# Patient Record
Sex: Male | Born: 1975 | Race: White | Hispanic: No | Marital: Married
Health system: Southern US, Community
[De-identification: ages and names within clinical notes are randomized; demographics above are authoritative.]

## PROBLEM LIST (undated history)

## (undated) DIAGNOSIS — J9621 Acute and chronic respiratory failure with hypoxia: Secondary | ICD-10-CM

---

## 2019-09-02 ENCOUNTER — Inpatient Hospital Stay
Admission: RE | Admit: 2019-09-02 | Discharge: 2019-10-03 | Disposition: A | Discharge status: Rehabilitation Facility | Payer: BLUE CROSS/BLUE SHIELD | Source: Other Acute Inpatient Hospital | Attending: Internal Medicine | Admitting: Internal Medicine

## 2019-09-02 ENCOUNTER — Other Ambulatory Visit (HOSPITAL_COMMUNITY): Payer: BLUE CROSS/BLUE SHIELD

## 2019-09-02 DIAGNOSIS — I63529 Cerebral infarction due to unspecified occlusion or stenosis of unspecified anterior cerebral artery: Secondary | ICD-10-CM

## 2019-09-02 DIAGNOSIS — I69391 Dysphagia following cerebral infarction: Secondary | ICD-10-CM

## 2019-09-02 DIAGNOSIS — J9621 Acute and chronic respiratory failure with hypoxia: Secondary | ICD-10-CM | POA: Diagnosis present

## 2019-09-02 DIAGNOSIS — G7080 Lambert-Eaton syndrome, unspecified: Secondary | ICD-10-CM

## 2019-09-02 DIAGNOSIS — J9690 Respiratory failure, unspecified, unspecified whether with hypoxia or hypercapnia: Secondary | ICD-10-CM

## 2019-09-02 DIAGNOSIS — Z431 Encounter for attention to gastrostomy: Secondary | ICD-10-CM

## 2019-09-02 HISTORY — DX: Acute and chronic respiratory failure with hypoxia: J96.21

## 2019-09-02 LAB — BLOOD GAS, ARTERIAL
Acid-Base Excess: 2.8 mmol/L — ABNORMAL HIGH (ref 0.0–2.0)
Bicarbonate: 26.1 mmol/L (ref 20.0–28.0)
FIO2: 30
O2 Saturation: 96.8 %
Patient temperature: 36.7
pCO2 arterial: 34.5 mmHg (ref 32.0–48.0)
pH, Arterial: 7.489 — ABNORMAL HIGH (ref 7.350–7.450)
pO2, Arterial: 76.6 mmHg — ABNORMAL LOW (ref 83.0–108.0)

## 2019-09-02 MED ORDER — IOHEXOL 300 MG/ML  SOLN
50.0000 mL | Freq: Once | INTRAMUSCULAR | Status: AC | PRN
  Administered 2019-09-02: 50 mL

## 2019-09-03 ENCOUNTER — Encounter: Payer: Self-pay | Admitting: Internal Medicine

## 2019-09-03 DIAGNOSIS — J9621 Acute and chronic respiratory failure with hypoxia: Secondary | ICD-10-CM | POA: Diagnosis not present

## 2019-09-03 DIAGNOSIS — I63521 Cerebral infarction due to unspecified occlusion or stenosis of right anterior cerebral artery: Secondary | ICD-10-CM | POA: Diagnosis not present

## 2019-09-03 DIAGNOSIS — I69391 Dysphagia following cerebral infarction: Secondary | ICD-10-CM

## 2019-09-03 DIAGNOSIS — G7080 Lambert-Eaton syndrome, unspecified: Secondary | ICD-10-CM

## 2019-09-03 LAB — COMPREHENSIVE METABOLIC PANEL
ALT: 44 U/L (ref 0–44)
AST: 23 U/L (ref 15–41)
Albumin: 3.5 g/dL (ref 3.5–5.0)
Alkaline Phosphatase: 53 U/L (ref 38–126)
Anion gap: 11 (ref 5–15)
BUN: 21 mg/dL — ABNORMAL HIGH (ref 6–20)
CO2: 27 mmol/L (ref 22–32)
Calcium: 9.9 mg/dL (ref 8.9–10.3)
Chloride: 100 mmol/L (ref 98–111)
Creatinine, Ser: 0.45 mg/dL — ABNORMAL LOW (ref 0.61–1.24)
GFR calc Af Amer: >60 mL/min (ref >60)
GFR calc non Af Amer: >60 mL/min (ref >60)
Glucose, Bld: 93 mg/dL (ref 70–99)
Potassium: 3.2 mmol/L — ABNORMAL LOW (ref 3.5–5.1)
Sodium: 138 mmol/L (ref 135–145)
Total Bilirubin: 0.8 mg/dL (ref 0.3–1.2)
Total Protein: 6.6 g/dL (ref 6.5–8.1)

## 2019-09-03 LAB — CBC
HCT: 32.2 % — ABNORMAL LOW (ref 39.0–52.0)
Hemoglobin: 9.6 g/dL — ABNORMAL LOW (ref 13.0–17.0)
MCH: 20.3 pg — ABNORMAL LOW (ref 26.0–34.0)
MCHC: 29.8 g/dL — ABNORMAL LOW (ref 30.0–36.0)
MCV: 67.9 fL — ABNORMAL LOW (ref 80.0–100.0)
Platelets: 317 10*3/uL (ref 150–400)
RBC: 4.74 MIL/uL (ref 4.22–5.81)
RDW: 19.4 % — ABNORMAL HIGH (ref 11.5–15.5)
WBC: 10.7 10*3/uL — ABNORMAL HIGH (ref 4.0–10.5)
nRBC: 0 % (ref 0.0–0.2)

## 2019-09-03 LAB — TSH: TSH: 1.191 u[IU]/mL (ref 0.350–4.500)

## 2019-09-03 NOTE — Consult Note (Signed)
Pulmonary Critical Care Medicine [facility] [place]  PULMONARY SERVICE  Date of Service: 09/03/2019  PULMONARY CRITICAL CARE CONSULT   Bernard Hudson  POE:[id number]  DOB: 1975/10/08   DOA: 09/02/2019  Referring Physician: Carron Curie, MD  HPI: Bernard Hudson is a 44 y.o. male seen for follow up of Acute on Chronic Respiratory Failure.  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  Patient was admitted to the neuro ICU for evaluation was found to be in acute respiratory failure altered mental status dysarthria failure to thrive and about a 40 pound weight loss.  Patient had apparently received the influenza vaccine and developed weakness was diagnosed with Guillain-Barr at that time.  It appears that he had an extensive work-up for his weakness and was eventually diagnosed with Eaton-Lambert syndrome.  Patient underwent plasma exchange x7 and was placed on Mestinon however patient developed diarrhea and did not have much in the way of response.  Patient also was treated with prednisone along with PCP prophylaxis.  For his respiratory failure patient had to have a tracheostomy done and still was reportedly having weakness on pressure support as he was getting fatigued.  He has had numerous attempts at weaning and has failed with retention of CO2 and that is felt that he is going to be vent dependent.  He did have a work-up for possible malignancy however an extensive work-up did not reveal anything significant.  Should be noted that he had a PET scan with hypermetabolic mediastinal lymph nodes but did not undergo biopsies.  Hematology oncology recommended a follow-up CT in 3 months.  Review of Systems:  ROS performed and is unremarkable other than noted above.  Past Medical History Past Medical History:   Diagnosis Date   Guillain Barr syndrome [facility])   Past Surgical History No past surgical history on file. Home Medications Prior to Admission medications  Medication Sig Start Date End Date Taking? Authorizing Provider  atorvastatin (LIPITOR) 20 MG tablet Take 20 mg by mouth daily. Historical Provider, MD  CALCIUM CARBONATE/VITAMIN D3 (VITAMIN D-3 ORAL) Take 1 tablet by mouth daily. Historical Provider, MD  dicyclomine (BENTYL) 20 mg tablet Take 20 mg by mouth. 06/27/19 07/07/19 Historical Provider, MD  pyridostigmine bromide (MESTINON) 60 mg tablet 60 mg per 1 tablet, ORAL, TID (3 times a day), 90 tablet, 3 Refill(s), Pharmacy: [id number], Height, 185.42, cm, 04/29/19 9:28:00 EDT, Weight, 78.926, kg, 04/29/19 9:28:00 EDT 04/29/19 Historical Provider, MD  silver sulfADIAZINE (SILVADENE, SSD) 1 % cream Apply 1 application topically daily. 11/16/14 11/16/15 Historical Provider, MD   Allergies No Known Allergies Family History No family history on file. Social History Social History   Socioeconomic History   Marital status: Married  Spouse name: Not on file   Number of children: Not on file   Years of education: Not on file   Highest education level: Not on file  Occupational History   Not on file  Tobacco Use   Smoking status: Current Every Day Smoker  Packs/day: 0.50  Years: 20.00  Pack years: 10.00  Types: Cigarettes   Smokeless tobacco: Never Used  Substance and Sexual Activity   Alcohol use: Not Currently   Drug use: Never   Sexual activity: Not on file  Other Topics Concern   Not on file  Social History Narrative  Not on file     Medications: Reviewed on Rounds  Physical Exam:  Vitals: Temperature 98.1 pulse 71 respiratory rate 15 blood pressure is 120/79 saturations 100%  Ventilator Settings patient was on ventilator with full support   General: Comfortable at this time  Eyes: Grossly normal lids, irises & conjunctiva  ENT:  grossly tongue is normal  Neck: no obvious mass  Cardiovascular: S1-S2 normal no gallop or rub  Respiratory: No rhonchi no rales are noted  Abdomen: Soft and nontender  Skin: no rash seen on limited exam  Musculoskeletal: not rigid  Psychiatric:unable to assess  Neurologic: no seizure no involuntary movements         Labs on Admission:  Basic Metabolic Panel: Recent Labs  Lab 09/03/19 0700  NA 138  K 3.2*  CL 100  CO2 27  GLUCOSE 93  BUN 21*  CREATININE 0.45*  CALCIUM 9.9    Recent Labs  Lab 09/02/19 1807  PHART 7.489*  PCO2ART 34.5  PO2ART 76.6*  HCO3 26.1  O2SAT 96.8    Liver Function Tests: Recent Labs  Lab 09/03/19 0700  AST 23  ALT 44  ALKPHOS 53  BILITOT 0.8  PROT 6.6  ALBUMIN 3.5   No results for input(s): LIPASE, AMYLASE in the last 168 hours. No results for input(s): AMMONIA in the last 168 hours.  CBC: Recent Labs  Lab 09/03/19 0700  WBC 10.7*  HGB 9.6*  HCT 32.2*  MCV 67.9*  PLT 317    Cardiac Enzymes: No results for input(s): CKTOTAL, CKMB, CKMBINDEX, TROPONINI in the last 168 hours.  BNP (last 3 results) No results for input(s): BNP in the last 8760 hours.  ProBNP (last 3 results) No results for input(s): PROBNP in the last 8760 hours.   Radiological Exams on Admission: DG ABDOMEN PEG TUBE LOCATION  Result Date: 09/02/2019 CLINICAL DATA:  Peg tube placement EXAM: ABDOMEN - 1 VIEW COMPARISON:  None. FINDINGS: 30 cc Omnipaque 300 administered through patient's PEG tube. A single overhead image of the abdomen obtained. Contrast opacifies the stomach and proximal duodenum. There is no gross extravasation of contrast. IMPRESSION: Gastrostomy tube appears to be within the stomach. No gross extravasation. Electronically Signed   By: Jasmine Pang M.D.   On: 09/02/2019 17:36   DG CHEST PORT 1 VIEW  Result Date: 09/02/2019 CLINICAL DATA:  Respiratory failure EXAM: PORTABLE CHEST 1 VIEW COMPARISON:  None. FINDINGS: Tracheostomy  tube in place with tip about 5 cm superior to the carina. No focal opacity or pleural effusion. Normal cardiomediastinal silhouette. No pneumothorax. IMPRESSION: No active disease. Electronically Signed   By: Jasmine Pang M.D.   On: 09/02/2019 17:36    Assessment/Plan Principal Problem:   Acute on chronic respiratory failure with hypoxia (HCC) Active Problems:   Eaton-Lambert myasthenic syndrome (HCC)   Acute arterial ischemic stroke, multifocal, anterior circulation (HCC)   Dysphagia as late effect of cerebrovascular accident (CVA)   1. Acute on chronic respiratory failure with hypoxia underlying patient has L EMS contributing to his overall weakness.  Patient has had plasma exchange done x7 as well as steroid therapy and Mestinon which does not appear to have responded to therapy.  Concern is that he may not be able to wean off the ventilator with persistence of failure on pressure support weaning attempts. 2. Eaton-Lambert syndrome patient had an extensive work-up however has not shown any obvious site of malignancy.  He did have enlarged lymph nodes felt possibly to be reactive by hematology  oncology they will see him after discharge 3. Acute stroke had a right frontal stroke in July 17 imaging studies.  Patient continues on anticoagulation. 4. Dysphagia secondary to the neuromuscular process going on patient is at risk for aspiration needs to be monitored closely.  Last chest x-ray done was actually clear  I have personally seen and evaluated the patient, evaluated laboratory and imaging results, formulated the assessment and plan and placed orders. The Patient requires high complexity decision making with multiple systems involvement.  Case was discussed on Rounds with the Respiratory Therapy Director and the Respiratory staff Time Spent  Yevonne Pax, MD [facility] Pulmonary Critical Care Medicine Sleep Medicine

## 2019-09-04 ENCOUNTER — Encounter: Payer: Self-pay | Admitting: Internal Medicine

## 2019-09-04 DIAGNOSIS — I69391 Dysphagia following cerebral infarction: Secondary | ICD-10-CM

## 2019-09-04 DIAGNOSIS — G7080 Lambert-Eaton syndrome, unspecified: Secondary | ICD-10-CM | POA: Diagnosis not present

## 2019-09-04 DIAGNOSIS — J9621 Acute and chronic respiratory failure with hypoxia: Secondary | ICD-10-CM | POA: Diagnosis not present

## 2019-09-04 DIAGNOSIS — I63521 Cerebral infarction due to unspecified occlusion or stenosis of right anterior cerebral artery: Secondary | ICD-10-CM | POA: Diagnosis not present

## 2019-09-04 DIAGNOSIS — I63529 Cerebral infarction due to unspecified occlusion or stenosis of unspecified anterior cerebral artery: Secondary | ICD-10-CM

## 2019-09-04 HISTORY — DX: Lambert-Eaton syndrome, unspecified: G70.80

## 2019-09-04 HISTORY — DX: Dysphagia following cerebral infarction: I69.391

## 2019-09-04 HISTORY — DX: Cerebral infarction due to unspecified occlusion or stenosis of unspecified anterior cerebral artery: I63.529

## 2019-09-04 LAB — POTASSIUM: Potassium: 4 mmol/L (ref 3.5–5.1)

## 2019-09-04 NOTE — Progress Notes (Addendum)
Pulmonary Critical Care Medicine [facility] [place]   PULMONARY CRITICAL CARE SERVICE  PROGRESS NOTE  Date of Service: 09/04/2019  Bernard Hudson  QBH:[id number]  DOB: 1975-05-02   DOA: 09/02/2019  Referring Physician: Carron Curie, MD  HPI: Bernard Hudson is a 44 y.o. male seen for follow up of Acute on Chronic Respiratory Failure.  Patient completed 2 hours on pressure support today is back on the pressure control on the vent rate of 15 with FiO2 of 30% satting well with no distress.  Medications: Reviewed on Rounds  Physical Exam:  Vitals: Pulse 75 respirations 20 BP 127/76 O2 sat 100% temp 96.9  Ventilator Settings Mild AC PC rate of 15 inspiratory pressure 15 PEEP of 5 and FiO2 30%  . General: Comfortable at this time . Eyes: Grossly normal lids, irises & conjunctiva . ENT: grossly tongue is normal . Neck: no obvious mass . Cardiovascular: S1 S2 normal no gallop . Respiratory: Coarse breath sounds . Abdomen: soft . Skin: no rash seen on limited exam . Musculoskeletal: not rigid . Psychiatric:unable to assess . Neurologic: no seizure no involuntary movements         Lab Data:   Basic Metabolic Panel: Recent Labs  Lab 09/03/19 0700 09/04/19 1446  NA 138  --   K 3.2* 4.0  CL 100  --   CO2 27  --   GLUCOSE 93  --   BUN 21*  --   CREATININE 0.45*  --   CALCIUM 9.9  --     ABG: Recent Labs  Lab 09/02/19 1807  PHART 7.489*  PCO2ART 34.5  PO2ART 76.6*  HCO3 26.1  O2SAT 96.8    Liver Function Tests: Recent Labs  Lab 09/03/19 0700  AST 23  ALT 44  ALKPHOS 53  BILITOT 0.8  PROT 6.6  ALBUMIN 3.5   No results for input(s): LIPASE, AMYLASE in the last 168 hours. No results for input(s): AMMONIA in the last 168 hours.  CBC: Recent Labs  Lab 09/03/19 0700  WBC 10.7*  HGB 9.6*  HCT 32.2*  MCV 67.9*  PLT 317    Cardiac Enzymes: No results for input(s): CKTOTAL, CKMB, CKMBINDEX, TROPONINI in the last 168 hours.  BNP (last 3  results) No results for input(s): BNP in the last 8760 hours.  ProBNP (last 3 results) No results for input(s): PROBNP in the last 8760 hours.  Radiological Exams: No results found.  Assessment/Plan Principal Problem:   Acute on chronic respiratory failure with hypoxia (HCC) Active Problems:   Eaton-Lambert myasthenic syndrome (HCC)   Acute arterial ischemic stroke, multifocal, anterior circulation (HCC)   Dysphagia as late effect of cerebrovascular accident (CVA)   1. Acute on chronic respiratory failure with hypoxia patient will continue to attempt weaning as tolerated completed 2 hours on pressure support today which was goal.  Continue aggressive pulmonary toilet supportive measures we will continue to wean FiO2 as tolerated. 2. Eaton-Lambert syndrome patient had an extensive work-up however has not shown any obvious site of malignancy.  He did have enlarged lymph nodes felt possibly to be reactive by hematology oncology they will see him after discharge 3. Acute stroke had a right frontal stroke in July 17 imaging studies.  Patient continues on anticoagulation. 4. Dysphagia secondary to the neuromuscular process going on patient is at risk for aspiration needs to be monitored closely.  Last chest x-ray done was actually clear   I have personally seen and evaluated the patient, evaluated laboratory and imaging  results, formulated the assessment and plan and placed orders. The Patient requires high complexity decision making with multiple systems involvement.  Rounds were done with the Respiratory Therapy Director and Staff therapists and discussed with nursing staff also.  Allyne Gee, MD [facility] Pulmonary Critical Care Medicine Sleep Medicine

## 2019-09-05 ENCOUNTER — Other Ambulatory Visit (HOSPITAL_COMMUNITY): Payer: BLUE CROSS/BLUE SHIELD

## 2019-09-05 DIAGNOSIS — G7080 Lambert-Eaton syndrome, unspecified: Secondary | ICD-10-CM | POA: Diagnosis not present

## 2019-09-05 DIAGNOSIS — I63521 Cerebral infarction due to unspecified occlusion or stenosis of right anterior cerebral artery: Secondary | ICD-10-CM | POA: Diagnosis not present

## 2019-09-05 DIAGNOSIS — J9621 Acute and chronic respiratory failure with hypoxia: Secondary | ICD-10-CM | POA: Diagnosis not present

## 2019-09-05 DIAGNOSIS — I69391 Dysphagia following cerebral infarction: Secondary | ICD-10-CM | POA: Diagnosis not present

## 2019-09-05 LAB — BASIC METABOLIC PANEL
Anion gap: 11 (ref 5–15)
BUN: 20 mg/dL (ref 6–20)
CO2: 23 mmol/L (ref 22–32)
Calcium: 9.5 mg/dL (ref 8.9–10.3)
Chloride: 101 mmol/L (ref 98–111)
Creatinine, Ser: <0.3 mg/dL — ABNORMAL LOW (ref 0.61–1.24)
Glucose, Bld: 87 mg/dL (ref 70–99)
Potassium: 3.5 mmol/L (ref 3.5–5.1)
Sodium: 135 mmol/L (ref 135–145)

## 2019-09-05 LAB — CBC
HCT: 30.7 % — ABNORMAL LOW (ref 39.0–52.0)
Hemoglobin: 9.4 g/dL — ABNORMAL LOW (ref 13.0–17.0)
MCH: 20.4 pg — ABNORMAL LOW (ref 26.0–34.0)
MCHC: 30.6 g/dL (ref 30.0–36.0)
MCV: 66.7 fL — ABNORMAL LOW (ref 80.0–100.0)
Platelets: 314 10*3/uL (ref 150–400)
RBC: 4.6 MIL/uL (ref 4.22–5.81)
RDW: 18.6 % — ABNORMAL HIGH (ref 11.5–15.5)
WBC: 9 10*3/uL (ref 4.0–10.5)
nRBC: 0 % (ref 0.0–0.2)

## 2019-09-05 NOTE — Progress Notes (Signed)
Pulmonary Critical Care Medicine [facility] [place]   PULMONARY CRITICAL CARE SERVICE  PROGRESS NOTE  Date of Service: 09/05/2019  Bernard Hudson  DXA:[id number]  DOB: 1975/02/09   DOA: 09/02/2019  Referring Physician: Carron Curie, MD  HPI: Bernard Hudson is a 44 y.o. male seen for follow up of Acute on Chronic Respiratory Failure.  Patient currently is on pressure support has been on 30% FiO2 this morning the goal is for 4 hours.  He actually looks good is got excellent volume so hopefully will be able to continue to advance  Medications: Reviewed on Rounds  Physical Exam:  Vitals: Temperature 97.8 pulse 62 respiratory rate 16 blood pressure 108/78 saturations 100%  Ventilator Settings on pressure support FiO2 30% pressure 12/5 tidal volume 705  . General: Comfortable at this time . Eyes: Grossly normal lids, irises & conjunctiva . ENT: grossly tongue is normal . Neck: no obvious mass . Cardiovascular: S1 S2 normal no gallop . Respiratory: No rhonchi coarse breath sounds . Abdomen: soft . Skin: no rash seen on limited exam . Musculoskeletal: not rigid . Psychiatric:unable to assess . Neurologic: no seizure no involuntary movements         Lab Data:   Basic Metabolic Panel: Recent Labs  Lab 09/03/19 0700 09/04/19 1446 09/05/19 0804  NA 138  --  135  K 3.2* 4.0 3.5  CL 100  --  101  CO2 27  --  23  GLUCOSE 93  --  87  BUN 21*  --  20  CREATININE 0.45*  --  <0.30*  CALCIUM 9.9  --  9.5    ABG: Recent Labs  Lab 09/02/19 1807  PHART 7.489*  PCO2ART 34.5  PO2ART 76.6*  HCO3 26.1  O2SAT 96.8    Liver Function Tests: Recent Labs  Lab 09/03/19 0700  AST 23  ALT 44  ALKPHOS 53  BILITOT 0.8  PROT 6.6  ALBUMIN 3.5   No results for input(s): LIPASE, AMYLASE in the last 168 hours. No results for input(s): AMMONIA in the last 168 hours.  CBC: Recent Labs  Lab 09/03/19 0700 09/05/19 0804  WBC 10.7* 9.0  HGB 9.6* 9.4*  HCT 32.2* 30.7*   MCV 67.9* 66.7*  PLT 317 314    Cardiac Enzymes: No results for input(s): CKTOTAL, CKMB, CKMBINDEX, TROPONINI in the last 168 hours.  BNP (last 3 results) No results for input(s): BNP in the last 8760 hours.  ProBNP (last 3 results) No results for input(s): PROBNP in the last 8760 hours.  Radiological Exams: DG CHEST PORT 1 VIEW  Result Date: 09/05/2019 CLINICAL DATA:  44 year old male with history of respiratory failure. EXAM: PORTABLE CHEST 1 VIEW COMPARISON:  Chest x-ray 09/02/2019. FINDINGS: A tracheostomy tube is in place with tip 6.0 cm above the carina. Lung volumes are normal. No consolidative airspace disease. No pleural effusions. No pneumothorax. No pulmonary nodule or mass noted. Pulmonary vasculature and the cardiomediastinal silhouette are within normal limits. IMPRESSION: 1. No radiographic evidence of acute cardiopulmonary disease. Tracheostomy tube appears appropriately positioned Electronically Signed   By: Trudie Reed M.D.   On: 09/05/2019 07:59    Assessment/Plan Principal Problem:   Acute on chronic respiratory failure with hypoxia (HCC) Active Problems:   Eaton-Lambert myasthenic syndrome (HCC)   Acute arterial ischemic stroke, multifocal, anterior circulation (HCC)   Dysphagia as late effect of cerebrovascular accident (CVA)   1. Acute on chronic respiratory failure hypoxia plan is to continue with the weaning on pressure. 2.  Eaton-Lambert myasthenia gravis syndrome plan is to continue with supportive care medical management. 3. Acute stroke no change supportive care 4. Dysphagia we will have speech assess   I have personally seen and evaluated the patient, evaluated laboratory and imaging results, formulated the assessment and plan and placed orders. The Patient requires high complexity decision making with multiple systems involvement.  Rounds were done with the Respiratory Therapy Director and Staff therapists and discussed with nursing staff  also.  Yevonne Pax, MD [facility] Pulmonary Critical Care Medicine Sleep Medicine

## 2019-09-06 DIAGNOSIS — G7080 Lambert-Eaton syndrome, unspecified: Secondary | ICD-10-CM | POA: Diagnosis not present

## 2019-09-06 DIAGNOSIS — I63521 Cerebral infarction due to unspecified occlusion or stenosis of right anterior cerebral artery: Secondary | ICD-10-CM | POA: Diagnosis not present

## 2019-09-06 DIAGNOSIS — J9621 Acute and chronic respiratory failure with hypoxia: Secondary | ICD-10-CM | POA: Diagnosis not present

## 2019-09-06 DIAGNOSIS — I69391 Dysphagia following cerebral infarction: Secondary | ICD-10-CM | POA: Diagnosis not present

## 2019-09-06 NOTE — Progress Notes (Addendum)
Pulmonary Critical Care Medicine [facility] [place]   PULMONARY CRITICAL CARE SERVICE  PROGRESS NOTE  Date of Service: 09/06/2019  Bernard Hudson  NLG:[id number]  DOB: [birth year]   DOA: 09/02/2019  Referring Physician: Carron Curie, MD  HPI: Bernard Hudson is a 44 y.o. male seen for follow up of Acute on Chronic Respiratory Failure.  Patient remains on full support pressure control mode rate 15 with FiO2 of 30% has an 8-hour on pressure support today however is not started weaning yet no distress or fever noted.  Medications: Reviewed on Rounds  Physical Exam:  Vitals: Pulse 70 respirations 15 BP 107/77 O2 sat 100% temp 96.9  Ventilator Settings ventilator mode AC PC rate of 15 expiratory pressure 15 PEEP of 5 and FiO2 of 30%  . General: Comfortable at this time . Eyes: Grossly normal lids, irises & conjunctiva . ENT: grossly tongue is normal . Neck: no obvious mass . Cardiovascular: S1 S2 normal no gallop . Respiratory: Coarse breath sounds . Abdomen: soft . Skin: no rash seen on limited exam . Musculoskeletal: not rigid . Psychiatric:unable to assess . Neurologic: no seizure no involuntary movements         Lab Data:   Basic Metabolic Panel: Recent Labs  Lab 09/03/19 0700 09/04/19 1446 09/05/19 0804  NA 138  --  135  K 3.2* 4.0 3.5  CL 100  --  101  CO2 27  --  23  GLUCOSE 93  --  87  BUN 21*  --  20  CREATININE 0.45*  --  <0.30*  CALCIUM 9.9  --  9.5    ABG: Recent Labs  Lab 09/02/19 1807  PHART 7.489*  PCO2ART 34.5  PO2ART 76.6*  HCO3 26.1  O2SAT 96.8    Liver Function Tests: Recent Labs  Lab 09/03/19 0700  AST 23  ALT 44  ALKPHOS 53  BILITOT 0.8  PROT 6.6  ALBUMIN 3.5   No results for input(s): LIPASE, AMYLASE in the last 168 hours. No results for input(s): AMMONIA in the last 168 hours.  CBC: Recent Labs  Lab 09/03/19 0700 09/05/19 0804  WBC 10.7* 9.0  HGB 9.6* 9.4*  HCT 32.2* 30.7*  MCV 67.9* 66.7*  PLT 317 314     Cardiac Enzymes: No results for input(s): CKTOTAL, CKMB, CKMBINDEX, TROPONINI in the last 168 hours.  BNP (last 3 results) No results for input(s): BNP in the last 8760 hours.  ProBNP (last 3 results) No results for input(s): PROBNP in the last 8760 hours.  Radiological Exams: DG CHEST PORT 1 VIEW  Result Date: 09/05/2019 CLINICAL DATA:  44 year old male with history of respiratory failure. EXAM: PORTABLE CHEST 1 VIEW COMPARISON:  Chest x-ray 09/02/2019. FINDINGS: A tracheostomy tube is in place with tip 6.0 cm above the carina. Lung volumes are normal. No consolidative airspace disease. No pleural effusions. No pneumothorax. No pulmonary nodule or mass noted. Pulmonary vasculature and the cardiomediastinal silhouette are within normal limits. IMPRESSION: 1. No radiographic evidence of acute cardiopulmonary disease. Tracheostomy tube appears appropriately positioned Electronically Signed   By: Trudie Reed M.D.   On: 09/05/2019 07:59    Assessment/Plan Principal Problem:   Acute on chronic respiratory failure with hypoxia (HCC) Active Problems:   Eaton-Lambert myasthenic syndrome (HCC)   Acute arterial ischemic stroke, multifocal, anterior circulation (HCC)   Dysphagia as late effect of cerebrovascular accident (CVA)   1. Acute on chronic respiratory failure hypoxia plan is to continue with the weaning on pressure support per  protocol.  Goals 8 hours today.  Continue supportive measures and pulmonary toilet 2. Eaton-Lambert myasthenia gravis syndrome plan is to continue with supportive care medical management. 3. Acute stroke no change supportive care 4. Dysphagia we will have speech assess   I have personally seen and evaluated the patient, evaluated laboratory and imaging results, formulated the assessment and plan and placed orders. The Patient requires high complexity decision making with multiple systems involvement.  Rounds were done with the Respiratory Therapy  Director and Staff therapists and discussed with nursing staff also.  Yevonne Pax, MD [facility] Pulmonary Critical Care Medicine Sleep Medicine

## 2019-09-07 DIAGNOSIS — I69391 Dysphagia following cerebral infarction: Secondary | ICD-10-CM | POA: Diagnosis not present

## 2019-09-07 DIAGNOSIS — G7080 Lambert-Eaton syndrome, unspecified: Secondary | ICD-10-CM | POA: Diagnosis not present

## 2019-09-07 DIAGNOSIS — J9621 Acute and chronic respiratory failure with hypoxia: Secondary | ICD-10-CM | POA: Diagnosis not present

## 2019-09-07 DIAGNOSIS — I63521 Cerebral infarction due to unspecified occlusion or stenosis of right anterior cerebral artery: Secondary | ICD-10-CM | POA: Diagnosis not present

## 2019-09-07 NOTE — Progress Notes (Signed)
Pulmonary Critical Care Medicine [facility] [place]   PULMONARY CRITICAL CARE SERVICE  PROGRESS NOTE  Date of Service: 09/07/2019  Glenroy Crossen  PPI:[id number]  DOB: 03-11-1975   DOA: 09/02/2019  Referring Physician: Carron Curie, MD  HPI: Bernard Hudson is a 44 y.o. male seen for follow up of Acute on Chronic Respiratory Failure.  Patient currently is on pressure assist control mode has been on 28% FiO2 good saturations are noted  Medications: Reviewed on Rounds  Physical Exam:  Vitals: Temperature is 96.6 pulse 67 respiratory 22 blood pressure is 175/68 saturations 100%  Ventilator Settings on pressure assist control FiO2 28% tidal volume 756 IP 15  . General: Comfortable at this time . Eyes: Grossly normal lids, irises & conjunctiva . ENT: grossly tongue is normal . Neck: no obvious mass . Cardiovascular: S1 S2 normal no gallop . Respiratory: Coarse breath sounds with few scattered rhonchi . Abdomen: soft . Skin: no rash seen on limited exam . Musculoskeletal: not rigid . Psychiatric:unable to assess . Neurologic: no seizure no involuntary movements         Lab Data:   Basic Metabolic Panel: Recent Labs  Lab 09/03/19 0700 09/04/19 1446 09/05/19 0804  NA 138  --  135  K 3.2* 4.0 3.5  CL 100  --  101  CO2 27  --  23  GLUCOSE 93  --  87  BUN 21*  --  20  CREATININE 0.45*  --  <0.30*  CALCIUM 9.9  --  9.5    ABG: Recent Labs  Lab 09/02/19 1807  PHART 7.489*  PCO2ART 34.5  PO2ART 76.6*  HCO3 26.1  O2SAT 96.8    Liver Function Tests: Recent Labs  Lab 09/03/19 0700  AST 23  ALT 44  ALKPHOS 53  BILITOT 0.8  PROT 6.6  ALBUMIN 3.5   No results for input(s): LIPASE, AMYLASE in the last 168 hours. No results for input(s): AMMONIA in the last 168 hours.  CBC: Recent Labs  Lab 09/03/19 0700 09/05/19 0804  WBC 10.7* 9.0  HGB 9.6* 9.4*  HCT 32.2* 30.7*  MCV 67.9* 66.7*  PLT 317 314    Cardiac Enzymes: No results for input(s):  CKTOTAL, CKMB, CKMBINDEX, TROPONINI in the last 168 hours.  BNP (last 3 results) No results for input(s): BNP in the last 8760 hours.  ProBNP (last 3 results) No results for input(s): PROBNP in the last 8760 hours.  Radiological Exams: No results found.  Assessment/Plan Principal Problem:   Acute on chronic respiratory failure with hypoxia (HCC) Active Problems:   Eaton-Lambert myasthenic syndrome (HCC)   Acute arterial ischemic stroke, multifocal, anterior circulation (HCC)   Dysphagia as late effect of cerebrovascular accident (CVA)   1. Acute on chronic respiratory failure hypoxia patient is on full support on pressure control mode overnight he states that he was unhappy with the staff.  They did not appear to understand his medical condition and he states that they were not very nice to him.  This morning as a result he is fatigued.  I spoke to asked him to go ahead and rested for the morning and then this afternoon later we can try once again on pressure support 2. Eaton-Lambert myasthenia patient will be continued on supportive care.  He needs to be weaned as much as he is able to tolerate 3. Acute stroke no change we will continue to follow 4. Dysphagia as a result of above we will continue to monitor   I  have personally seen and evaluated the patient, evaluated laboratory and imaging results, formulated the assessment and plan and placed orders. The Patient requires high complexity decision making with multiple systems involvement.  Rounds were done with the Respiratory Therapy Director and Staff therapists and discussed with nursing staff also.  Allyne Gee, MD [facility] Pulmonary Critical Care Medicine Sleep Medicine

## 2019-09-08 DIAGNOSIS — G7080 Lambert-Eaton syndrome, unspecified: Secondary | ICD-10-CM | POA: Diagnosis not present

## 2019-09-08 DIAGNOSIS — I69391 Dysphagia following cerebral infarction: Secondary | ICD-10-CM | POA: Diagnosis not present

## 2019-09-08 DIAGNOSIS — I63521 Cerebral infarction due to unspecified occlusion or stenosis of right anterior cerebral artery: Secondary | ICD-10-CM | POA: Diagnosis not present

## 2019-09-08 DIAGNOSIS — J9621 Acute and chronic respiratory failure with hypoxia: Secondary | ICD-10-CM | POA: Diagnosis not present

## 2019-09-08 NOTE — Progress Notes (Signed)
Pulmonary Critical Care Medicine [facility] [place]   PULMONARY CRITICAL CARE SERVICE  PROGRESS NOTE  Date of Service: 09/08/2019  [name]  NWG:[id number]  DOB: 06-11-1975   DOA: 09/02/2019  Referring Physician: Carron Curie, MD  HPI: Bernard Hudson is a 44 y.o. male seen for follow up of Acute on Chronic Respiratory Failure.  Patient at this time is on pressure support appears to be comfortable right now without distress has been on 28% FiO2 right now is on 12/5  Medications: Reviewed on Rounds  Physical Exam:  Vitals: Temperature is 96.8 pulse 61 respiratory 15 blood pressure is 125/70 saturations 98%  Ventilator Settings on pressure support FiO2 is 28% tidal volume 655 pressure support 12/5 goal of 16 hours  . General: Comfortable at this time . Eyes: Grossly normal lids, irises & conjunctiva . ENT: grossly tongue is normal . Neck: no obvious mass . Cardiovascular: S1 S2 normal no gallop . Respiratory: Scattered rhonchi noted . Abdomen: soft . Skin: no rash seen on limited exam . Musculoskeletal: not rigid . Psychiatric:unable to assess . Neurologic: no seizure no involuntary movements         Lab Data:   Basic Metabolic Panel: Recent Labs  Lab 09/03/19 0700 09/04/19 1446 09/05/19 0804  NA 138  --  135  K 3.2* 4.0 3.5  CL 100  --  101  CO2 27  --  23  GLUCOSE 93  --  87  BUN 21*  --  20  CREATININE 0.45*  --  <0.30*  CALCIUM 9.9  --  9.5    ABG: Recent Labs  Lab 09/02/19 1807  PHART 7.489*  PCO2ART 34.5  PO2ART 76.6*  HCO3 26.1  O2SAT 96.8    Liver Function Tests: Recent Labs  Lab 09/03/19 0700  AST 23  ALT 44  ALKPHOS 53  BILITOT 0.8  PROT 6.6  ALBUMIN 3.5   No results for input(s): LIPASE, AMYLASE in the last 168 hours. No results for input(s): AMMONIA in the last 168 hours.  CBC: Recent Labs  Lab 09/03/19 0700 09/05/19 0804  WBC 10.7* 9.0  HGB 9.6* 9.4*  HCT 32.2* 30.7*  MCV 67.9* 66.7*  PLT 317 314     Cardiac Enzymes: No results for input(s): CKTOTAL, CKMB, CKMBINDEX, TROPONINI in the last 168 hours.  BNP (last 3 results) No results for input(s): BNP in the last 8760 hours.  ProBNP (last 3 results) No results for input(s): PROBNP in the last 8760 hours.  Radiological Exams: No results found.  Assessment/Plan Principal Problem:   Acute on chronic respiratory failure with hypoxia (HCC) Active Problems:   Eaton-Lambert myasthenic syndrome (HCC)   Acute arterial ischemic stroke, multifocal, anterior circulation (HCC)   Dysphagia as late effect of cerebrovascular accident (CVA)   1. Acute on chronic respiratory failure hypoxia plan is to continue with pressure support mode titrate oxygen continue pulmonary toilet. 2. Eaton-Lambert syndrome supportive care therapy as tolerated 3. Acute stroke no change 4. Dysphagia at baseline continue with supportive care   I have personally seen and evaluated the patient, evaluated laboratory and imaging results, formulated the assessment and plan and placed orders. The Patient requires high complexity decision making with multiple systems involvement.  Rounds were done with the Respiratory Therapy Director and Staff therapists and discussed with nursing staff also.  Yevonne Pax, MD [facility] Pulmonary Critical Care Medicine Sleep Medicine

## 2019-09-09 DIAGNOSIS — I69391 Dysphagia following cerebral infarction: Secondary | ICD-10-CM | POA: Diagnosis not present

## 2019-09-09 DIAGNOSIS — I63521 Cerebral infarction due to unspecified occlusion or stenosis of right anterior cerebral artery: Secondary | ICD-10-CM | POA: Diagnosis not present

## 2019-09-09 DIAGNOSIS — J9621 Acute and chronic respiratory failure with hypoxia: Secondary | ICD-10-CM | POA: Diagnosis not present

## 2019-09-09 DIAGNOSIS — G7080 Lambert-Eaton syndrome, unspecified: Secondary | ICD-10-CM | POA: Diagnosis not present

## 2019-09-09 NOTE — Progress Notes (Addendum)
Pulmonary Critical Care Medicine [facility] [place]   PULMONARY CRITICAL CARE SERVICE  PROGRESS NOTE  Date of Service: 09/09/2019  Bernard Hudson  [id number]:[id number]  DOB: 08/13/1975   DOA: 09/02/2019  Referring Physician: Carron Curie, MD  HPI: Bernard Hudson is a 44 y.o. male seen for follow up of Acute on Chronic Respiratory Failure.  Patient completed 2 hours today on aerosol trach collar 28% now back resting on full support rate of 15 satting well no distress.  Medications: Reviewed on Rounds  Physical Exam:  Vitals: Pulse 73 respirations 19 BP 117/80 O2 sat 100% temp 97.1  Ventilator Settings ventilator mode AC PC rate of 15 expiratory pressure of 15 PEEP of 5 and FiO2 of 28%  . General: Comfortable at this time . Eyes: Grossly normal lids, irises & conjunctiva . ENT: grossly tongue is normal . Neck: no obvious mass . Cardiovascular: S1 S2 normal no gallop . Respiratory: Coarse breath sounds . Abdomen: soft . Skin: no rash seen on limited exam . Musculoskeletal: not rigid . Psychiatric:unable to assess . Neurologic: no seizure no involuntary movements         Lab Data:   Basic Metabolic Panel: Recent Labs  Lab 09/03/19 0700 09/04/19 1446 09/05/19 0804  NA 138  --  135  K 3.2* 4.0 3.5  CL 100  --  101  CO2 27  --  23  GLUCOSE 93  --  87  BUN 21*  --  20  CREATININE 0.45*  --  <0.30*  CALCIUM 9.9  --  9.5    ABG: No results for input(s): PHART, PCO2ART, PO2ART, HCO3, O2SAT in the last 168 hours.  Liver Function Tests: Recent Labs  Lab 09/03/19 0700  AST 23  ALT 44  ALKPHOS 53  BILITOT 0.8  PROT 6.6  ALBUMIN 3.5   No results for input(s): LIPASE, AMYLASE in the last 168 hours. No results for input(s): AMMONIA in the last 168 hours.  CBC: Recent Labs  Lab 09/03/19 0700 09/05/19 0804  WBC 10.7* 9.0  HGB 9.6* 9.4*  HCT 32.2* 30.7*  MCV 67.9* 66.7*  PLT 317 314    Cardiac Enzymes: No results for input(s): CKTOTAL, CKMB,  CKMBINDEX, TROPONINI in the last 168 hours.  BNP (last 3 results) No results for input(s): BNP in the last 8760 hours.  ProBNP (last 3 results) No results for input(s): PROBNP in the last 8760 hours.  Radiological Exams: No results found.  Assessment/Plan Principal Problem:   Acute on chronic respiratory failure with hypoxia (HCC) Active Problems:   Eaton-Lambert myasthenic syndrome (HCC)   Acute arterial ischemic stroke, multifocal, anterior circulation (HCC)   Dysphagia as late effect of cerebrovascular accident (CVA)   1. Acute on chronic respiratory failure hypoxia patient completed 2 hours on aerosol trach collar for weaning.  Now back on full support continue supportive measures and aggressive pulmonary toilet. 2. Eaton-Lambert syndrome supportive care therapy as tolerated 3. Acute stroke no change 4. Dysphagia at baseline continue with supportive care   I have personally seen and evaluated the patient, evaluated laboratory and imaging results, formulated the assessment and plan and placed orders. The Patient requires high complexity decision making with multiple systems involvement.  Rounds were done with the Respiratory Therapy Director and Staff therapists and discussed with nursing staff also.  Yevonne Pax, MD [facility] Pulmonary Critical Care Medicine Sleep Medicine

## 2019-09-10 ENCOUNTER — Other Ambulatory Visit (HOSPITAL_COMMUNITY): Payer: BLUE CROSS/BLUE SHIELD

## 2019-09-10 LAB — CBC
HCT: 33.6 % — ABNORMAL LOW (ref 39.0–52.0)
Hemoglobin: 10.3 g/dL — ABNORMAL LOW (ref 13.0–17.0)
MCH: 21 pg — ABNORMAL LOW (ref 26.0–34.0)
MCHC: 30.7 g/dL (ref 30.0–36.0)
MCV: 68.4 fL — ABNORMAL LOW (ref 80.0–100.0)
Platelets: 287 10*3/uL (ref 150–400)
RBC: 4.91 MIL/uL (ref 4.22–5.81)
RDW: 19.2 % — ABNORMAL HIGH (ref 11.5–15.5)
WBC: 9.6 10*3/uL (ref 4.0–10.5)
nRBC: 0 % (ref 0.0–0.2)

## 2019-09-10 LAB — BASIC METABOLIC PANEL
Anion gap: 13 (ref 5–15)
BUN: 19 mg/dL (ref 6–20)
CO2: 24 mmol/L (ref 22–32)
Calcium: 9.6 mg/dL (ref 8.9–10.3)
Chloride: 99 mmol/L (ref 98–111)
Creatinine, Ser: 0.34 mg/dL — ABNORMAL LOW (ref 0.61–1.24)
GFR calc Af Amer: >60 mL/min (ref >60)
GFR calc non Af Amer: >60 mL/min (ref >60)
Glucose, Bld: 98 mg/dL (ref 70–99)
Potassium: 4.1 mmol/L (ref 3.5–5.1)
Sodium: 136 mmol/L (ref 135–145)

## 2019-09-11 DIAGNOSIS — J9621 Acute and chronic respiratory failure with hypoxia: Secondary | ICD-10-CM | POA: Diagnosis not present

## 2019-09-11 DIAGNOSIS — I63521 Cerebral infarction due to unspecified occlusion or stenosis of right anterior cerebral artery: Secondary | ICD-10-CM | POA: Diagnosis not present

## 2019-09-11 DIAGNOSIS — I69391 Dysphagia following cerebral infarction: Secondary | ICD-10-CM | POA: Diagnosis not present

## 2019-09-11 DIAGNOSIS — G7080 Lambert-Eaton syndrome, unspecified: Secondary | ICD-10-CM | POA: Diagnosis not present

## 2019-09-11 NOTE — Progress Notes (Signed)
Pulmonary Critical Care Medicine [facility] [place]   PULMONARY CRITICAL CARE SERVICE  PROGRESS NOTE  Date of Service: 09/11/2019  Bernard Hudson  ELF:[id number]  DOB: [birth year]   DOA: 09/02/2019  Referring Physician: Carron Curie, MD  HPI: Bernard Hudson is a 44 y.o. male seen for follow up of Acute on Chronic Respiratory Failure.  Patient currently is on pressure support mode has been on 28% FiO2 on 12/5 yesterday patient was able to do about 4 hours on the T collar  Medications: Reviewed on Rounds  Physical Exam:  Vitals: Temperature is 96.5 pulse 78 respiratory 15 blood pressure is 100/59 saturations 99%  Ventilator Settings on pressure support FiO2 28% 12/5  . General: Comfortable at this time . Eyes: Grossly normal lids, irises & conjunctiva . ENT: grossly tongue is normal . Neck: no obvious mass . Cardiovascular: S1 S2 normal no gallop . Respiratory: No rhonchi no rales are noted at this time . Abdomen: soft . Skin: no rash seen on limited exam . Musculoskeletal: not rigid . Psychiatric:unable to assess . Neurologic: no seizure no involuntary movements         Lab Data:   Basic Metabolic Panel: Recent Labs  Lab 09/04/19 1446 09/05/19 0804 09/10/19 0659  NA  --  135 136  K 4.0 3.5 4.1  CL  --  101 99  CO2  --  23 24  GLUCOSE  --  87 98  BUN  --  20 19  CREATININE  --  <0.30* 0.34*  CALCIUM  --  9.5 9.6    ABG: No results for input(s): PHART, PCO2ART, PO2ART, HCO3, O2SAT in the last 168 hours.  Liver Function Tests: No results for input(s): AST, ALT, ALKPHOS, BILITOT, PROT, ALBUMIN in the last 168 hours. No results for input(s): LIPASE, AMYLASE in the last 168 hours. No results for input(s): AMMONIA in the last 168 hours.  CBC: Recent Labs  Lab 09/05/19 0804 09/10/19 0659  WBC 9.0 9.6  HGB 9.4* 10.3*  HCT 30.7* 33.6*  MCV 66.7* 68.4*  PLT 314 287    Cardiac Enzymes: No results for input(s): CKTOTAL, CKMB, CKMBINDEX, TROPONINI  in the last 168 hours.  BNP (last 3 results) No results for input(s): BNP in the last 8760 hours.  ProBNP (last 3 results) No results for input(s): PROBNP in the last 8760 hours.  Radiological Exams: DG CHEST PORT 1 VIEW  Result Date: 09/10/2019 CLINICAL DATA:  Respiratory failure. EXAM: PORTABLE CHEST 1 VIEW COMPARISON:  09/05/2019 FINDINGS: Tracheostomy tube tip is above the carina. Stable cardiomediastinal contours. No pleural effusion or edema. No airspace densities. IMPRESSION: No active cardiopulmonary abnormalities. Electronically Signed   By: Signa Kell M.D.   On: 09/10/2019 08:39    Assessment/Plan Principal Problem:   Acute on chronic respiratory failure with hypoxia (HCC) Active Problems:   Eaton-Lambert myasthenic syndrome (HCC)   Acute arterial ischemic stroke, multifocal, anterior circulation (HCC)   Dysphagia as late effect of cerebrovascular accident (CVA)   1. Acute on chronic respiratory failure hypoxia we will continue with pressure support titrate oxygen continue pulmonary toilet today the plan is to do T collar up to 8 hours.  Encouraged compliance with recommended therapy 2. Eaton-Lambert syndrome no change we will continue with supportive care need to review the chart consider rituximab therapy patient is on a taper of prednisone would consider maybe holding off on aggressive weaning of the prednisone. 3. Acute stroke at baseline we will follow physical therapy as tolerated 4.  Dysphagia no change supportive care   I have personally seen and evaluated the patient, evaluated laboratory and imaging results, formulated the assessment and plan and placed orders. The Patient requires high complexity decision making with multiple systems involvement.  Rounds were done with the Respiratory Therapy Director and Staff therapists and discussed with nursing staff also.  Time 35 minutes  Yevonne Pax, MD [facility] Pulmonary Critical Care Medicine Sleep Medicine

## 2019-09-12 DIAGNOSIS — I69391 Dysphagia following cerebral infarction: Secondary | ICD-10-CM | POA: Diagnosis not present

## 2019-09-12 DIAGNOSIS — I63521 Cerebral infarction due to unspecified occlusion or stenosis of right anterior cerebral artery: Secondary | ICD-10-CM | POA: Diagnosis not present

## 2019-09-12 DIAGNOSIS — J9621 Acute and chronic respiratory failure with hypoxia: Secondary | ICD-10-CM | POA: Diagnosis not present

## 2019-09-12 DIAGNOSIS — G7080 Lambert-Eaton syndrome, unspecified: Secondary | ICD-10-CM | POA: Diagnosis not present

## 2019-09-12 NOTE — Progress Notes (Signed)
Pulmonary Critical Care Medicine [facility] [place]   PULMONARY CRITICAL CARE SERVICE  PROGRESS NOTE  Date of Service: 09/12/2019  Bernard Hudson  FVC:[id number]  DOB: Apr 18, 1975   DOA: 09/02/2019  Referring Physician: Carron Curie, MD  HPI: Bernard Hudson is a 44 y.o. male seen for follow up of Acute on Chronic Respiratory Failure.  Patient was able to do 12 hours yesterday on T collar looks good this morning  Medications: Reviewed on Rounds  Physical Exam:  Vitals: Temperature is 97.4 pulse 84 respiratory 15 blood pressure is 100/71 saturations 100%  Ventilator Settings on T collar  . General: Comfortable at this time . Eyes: Grossly normal lids, irises & conjunctiva . ENT: grossly tongue is normal . Neck: no obvious mass . Cardiovascular: S1 S2 normal no gallop . Respiratory: No rhonchi no rales are noted at this time . Abdomen: soft . Skin: no rash seen on limited exam . Musculoskeletal: not rigid . Psychiatric:unable to assess . Neurologic: no seizure no involuntary movements         Lab Data:   Basic Metabolic Panel: Recent Labs  Lab 09/10/19 0659  NA 136  K 4.1  CL 99  CO2 24  GLUCOSE 98  BUN 19  CREATININE 0.34*  CALCIUM 9.6    ABG: No results for input(s): PHART, PCO2ART, PO2ART, HCO3, O2SAT in the last 168 hours.  Liver Function Tests: No results for input(s): AST, ALT, ALKPHOS, BILITOT, PROT, ALBUMIN in the last 168 hours. No results for input(s): LIPASE, AMYLASE in the last 168 hours. No results for input(s): AMMONIA in the last 168 hours.  CBC: Recent Labs  Lab 09/10/19 0659  WBC 9.6  HGB 10.3*  HCT 33.6*  MCV 68.4*  PLT 287    Cardiac Enzymes: No results for input(s): CKTOTAL, CKMB, CKMBINDEX, TROPONINI in the last 168 hours.  BNP (last 3 results) No results for input(s): BNP in the last 8760 hours.  ProBNP (last 3 results) No results for input(s): PROBNP in the last 8760 hours.  Radiological Exams: No results  found.  Assessment/Plan Principal Problem:   Acute on chronic respiratory failure with hypoxia (HCC) Active Problems:   Eaton-Lambert myasthenic syndrome (HCC)   Acute arterial ischemic stroke, multifocal, anterior circulation (HCC)   Dysphagia as late effect of cerebrovascular accident (CVA)   1. Acute on chronic respiratory failure hypoxia plan is to continue with T collar beyond 12 hours. 2. Eaton-Lambert syndrome seems to show some increase in strength continue with supportive care. 3. Acute stroke patient is at baseline slow improvement 4. Dysphagia slow improvement we will follow   I have personally seen and evaluated the patient, evaluated laboratory and imaging results, formulated the assessment and plan and placed orders. The Patient requires high complexity decision making with multiple systems involvement.  Rounds were done with the Respiratory Therapy Director and Staff therapists and discussed with nursing staff also.  Yevonne Pax, MD [facility] Pulmonary Critical Care Medicine Sleep Medicine

## 2019-09-13 DIAGNOSIS — I63521 Cerebral infarction due to unspecified occlusion or stenosis of right anterior cerebral artery: Secondary | ICD-10-CM | POA: Diagnosis not present

## 2019-09-13 DIAGNOSIS — I69391 Dysphagia following cerebral infarction: Secondary | ICD-10-CM | POA: Diagnosis not present

## 2019-09-13 DIAGNOSIS — G7080 Lambert-Eaton syndrome, unspecified: Secondary | ICD-10-CM | POA: Diagnosis not present

## 2019-09-13 DIAGNOSIS — J9621 Acute and chronic respiratory failure with hypoxia: Secondary | ICD-10-CM | POA: Diagnosis not present

## 2019-09-13 NOTE — Progress Notes (Addendum)
Pulmonary Critical Care Medicine [facility] [place]   PULMONARY CRITICAL CARE SERVICE  PROGRESS NOTE  Date of Service: 09/13/2019  Bernard Hudson  QPR:[id number]  DOB: 04-21-1975   DOA: 09/02/2019  Referring Physician: Carron Curie, MD  HPI: Bernard Hudson is a 44 y.o. male seen for follow up of Acute on Chronic Respiratory Failure.  Patient remains on aerosol trach collar 28% for 16-hour goal today currently satting well no fever distress.  Medications: Reviewed on Rounds  Physical Exam:  Vitals: Pulse 70 respirations 15 BP 93/73 O2 sat 100% temp 97.0  Ventilator Settings ATC 28%  . General: Comfortable at this time . Eyes: Grossly normal lids, irises & conjunctiva . ENT: grossly tongue is normal . Neck: no obvious mass . Cardiovascular: S1 S2 normal no gallop . Respiratory: Coarse breath sounds . Abdomen: soft . Skin: no rash seen on limited exam . Musculoskeletal: not rigid . Psychiatric:unable to assess . Neurologic: no seizure no involuntary movements         Lab Data:   Basic Metabolic Panel: Recent Labs  Lab 09/10/19 0659  NA 136  K 4.1  CL 99  CO2 24  GLUCOSE 98  BUN 19  CREATININE 0.34*  CALCIUM 9.6    ABG: No results for input(s): PHART, PCO2ART, PO2ART, HCO3, O2SAT in the last 168 hours.  Liver Function Tests: No results for input(s): AST, ALT, ALKPHOS, BILITOT, PROT, ALBUMIN in the last 168 hours. No results for input(s): LIPASE, AMYLASE in the last 168 hours. No results for input(s): AMMONIA in the last 168 hours.  CBC: Recent Labs  Lab 09/10/19 0659  WBC 9.6  HGB 10.3*  HCT 33.6*  MCV 68.4*  PLT 287    Cardiac Enzymes: No results for input(s): CKTOTAL, CKMB, CKMBINDEX, TROPONINI in the last 168 hours.  BNP (last 3 results) No results for input(s): BNP in the last 8760 hours.  ProBNP (last 3 results) No results for input(s): PROBNP in the last 8760 hours.  Radiological Exams: No results  found.  Assessment/Plan Principal Problem:   Acute on chronic respiratory failure with hypoxia (HCC) Active Problems:   Eaton-Lambert myasthenic syndrome (HCC)   Acute arterial ischemic stroke, multifocal, anterior circulation (HCC)   Dysphagia as late effect of cerebrovascular accident (CVA)   1. Acute on chronic respiratory failure hypoxia patient remains on aerosol trach collar 28% has a goal of 16 hours today we will continue aggressive pulmonary toilet supportive measures.. 2. Eaton-Lambert syndrome seems to show some increase in strength continue with supportive care. 3. Acute stroke patient is at baseline slow improvement 4. Dysphagia slow improvement we will follow    I have personally seen and evaluated the patient, evaluated laboratory and imaging results, formulated the assessment and plan and placed orders. The Patient requires high complexity decision making with multiple systems involvement.  Rounds were done with the Respiratory Therapy Director and Staff therapists and discussed with nursing staff also.  Yevonne Pax, MD [facility] Pulmonary Critical Care Medicine Sleep Medicine

## 2019-09-14 DIAGNOSIS — I63521 Cerebral infarction due to unspecified occlusion or stenosis of right anterior cerebral artery: Secondary | ICD-10-CM | POA: Diagnosis not present

## 2019-09-14 DIAGNOSIS — G7080 Lambert-Eaton syndrome, unspecified: Secondary | ICD-10-CM | POA: Diagnosis not present

## 2019-09-14 DIAGNOSIS — J9621 Acute and chronic respiratory failure with hypoxia: Secondary | ICD-10-CM | POA: Diagnosis not present

## 2019-09-14 DIAGNOSIS — I69391 Dysphagia following cerebral infarction: Secondary | ICD-10-CM | POA: Diagnosis not present

## 2019-09-14 NOTE — Progress Notes (Addendum)
Pulmonary Critical Care Medicine [facility] [place]   PULMONARY CRITICAL CARE SERVICE  PROGRESS NOTE  Date of Service: 09/14/2019  Bernard Hudson  [id number]  DOB: 1975/04/24   DOA: 09/02/2019  Referring Physician: Carron Curie, MD  HPI: Bernard Hudson is a 44 y.o. male seen for follow up of Acute on Chronic Respiratory Failure. Patient remains weaning on ATC 28%, for a 20 hour goal.    Medications: Reviewed on Rounds  Physical Exam:  Vitals: pulse 75, resp 20, bp 110/70, sat 98%, temp 97.0  Ventilator Settings ATC 28%  . General: Comfortable at this time . Eyes: Grossly normal lids, irises & conjunctiva . ENT: grossly tongue is normal . Neck: no obvious mass . Cardiovascular: S1 S2 normal no gallop . Respiratory: no rales or ronchi noted . Abdomen: soft . Skin: no rash seen on limited exam . Musculoskeletal: not rigid . Psychiatric:unable to assess . Neurologic: no seizure no involuntary movements         Lab Data:   Basic Metabolic Panel: Recent Labs  Lab 09/10/19 0659  NA 136  K 4.1  CL 99  CO2 24  GLUCOSE 98  BUN 19  CREATININE 0.34*  CALCIUM 9.6    ABG: No results for input(s): PHART, PCO2ART, PO2ART, HCO3, O2SAT in the last 168 hours.  Liver Function Tests: No results for input(s): AST, ALT, ALKPHOS, BILITOT, PROT, ALBUMIN in the last 168 hours. No results for input(s): LIPASE, AMYLASE in the last 168 hours. No results for input(s): AMMONIA in the last 168 hours.  CBC: Recent Labs  Lab 09/10/19 0659  WBC 9.6  HGB 10.3*  HCT 33.6*  MCV 68.4*  PLT 287    Cardiac Enzymes: No results for input(s): CKTOTAL, CKMB, CKMBINDEX, TROPONINI in the last 168 hours.  BNP (last 3 results) No results for input(s): BNP in the last 8760 hours.  ProBNP (last 3 results) No results for input(s): PROBNP in the last 8760 hours.  Radiological Exams: No results found.  Assessment/Plan Principal Problem:   Acute on chronic respiratory  failure with hypoxia (HCC) Active Problems:   Eaton-Lambert myasthenic syndrome (HCC)   Acute arterial ischemic stroke, multifocal, anterior circulation (HCC)   Dysphagia as late effect of cerebrovascular accident (CVA)   1. Acute on chronic respiratory failure hypoxia patient remains on aerosol trach collar 28% has a goal of 20 hours today we will continue aggressive pulmonary toilet supportive measures.. 2. Eaton-Lambert syndrome seems to show some increase in strength continue with supportive care. 3. Acute stroke patient is at baseline slow improvement 4. Dysphagia slow improvement we will follow   I have personally seen and evaluated the patient, evaluated laboratory and imaging results, formulated the assessment and plan and placed orders. The Patient requires high complexity decision making with multiple systems involvement.  Rounds were done with the Respiratory Therapy Director and Staff therapists and discussed with nursing staff also.  Yevonne Pax, MD [facility] Pulmonary Critical Care Medicine Sleep Medicine

## 2019-09-15 DIAGNOSIS — I69391 Dysphagia following cerebral infarction: Secondary | ICD-10-CM | POA: Diagnosis not present

## 2019-09-15 DIAGNOSIS — J9621 Acute and chronic respiratory failure with hypoxia: Secondary | ICD-10-CM | POA: Diagnosis not present

## 2019-09-15 DIAGNOSIS — G7080 Lambert-Eaton syndrome, unspecified: Secondary | ICD-10-CM | POA: Diagnosis not present

## 2019-09-15 DIAGNOSIS — I63521 Cerebral infarction due to unspecified occlusion or stenosis of right anterior cerebral artery: Secondary | ICD-10-CM | POA: Diagnosis not present

## 2019-09-15 LAB — BLOOD GAS, ARTERIAL
Acid-Base Excess: 3.8 mmol/L — ABNORMAL HIGH (ref 0.0–2.0)
Bicarbonate: 27.9 mmol/L (ref 20.0–28.0)
FIO2: 28
O2 Saturation: 97.5 %
Patient temperature: 36.5
pCO2 arterial: 42.1 mmHg (ref 32.0–48.0)
pH, Arterial: 7.434 (ref 7.350–7.450)
pO2, Arterial: 88.7 mmHg (ref 83.0–108.0)

## 2019-09-15 NOTE — Progress Notes (Addendum)
Pulmonary Critical Care Medicine [facility] [place]   PULMONARY CRITICAL CARE SERVICE  PROGRESS NOTE  Date of Service: 09/15/2019  Bernard Hudson  FMB:[id number]  DOB: 1975/10/15   DOA: 09/02/2019  Referring Physician: Carron Curie, MD  HPI: Bernard Hudson is a 44 y.o. male seen for follow up of Acute on Chronic Respiratory Failure.  Patient trach was downsized to a #6 cuffless today remains on aerosol trach collar 28% for goal of 24 hours at this time.  Medications: Reviewed on Rounds  Physical Exam:  Vitals: Pulse 81 respirations 23 BP 98/78 O2 sat 99% temp 96.7  Ventilator Settings ATC 28%  . General: Comfortable at this time . Eyes: Grossly normal lids, irises & conjunctiva . ENT: grossly tongue is normal . Neck: no obvious mass . Cardiovascular: S1 S2 normal no gallop . Respiratory: No rales or rhonchi noted . Abdomen: soft . Skin: no rash seen on limited exam . Musculoskeletal: not rigid . Psychiatric:unable to assess . Neurologic: no seizure no involuntary movements         Lab Data:   Basic Metabolic Panel: Recent Labs  Lab 09/10/19 0659  NA 136  K 4.1  CL 99  CO2 24  GLUCOSE 98  BUN 19  CREATININE 0.34*  CALCIUM 9.6    ABG: Recent Labs  Lab 09/15/19 1045  PHART 7.434  PCO2ART 42.1  PO2ART 88.7  HCO3 27.9  O2SAT 97.5    Liver Function Tests: No results for input(s): AST, ALT, ALKPHOS, BILITOT, PROT, ALBUMIN in the last 168 hours. No results for input(s): LIPASE, AMYLASE in the last 168 hours. No results for input(s): AMMONIA in the last 168 hours.  CBC: Recent Labs  Lab 09/10/19 0659  WBC 9.6  HGB 10.3*  HCT 33.6*  MCV 68.4*  PLT 287    Cardiac Enzymes: No results for input(s): CKTOTAL, CKMB, CKMBINDEX, TROPONINI in the last 168 hours.  BNP (last 3 results) No results for input(s): BNP in the last 8760 hours.  ProBNP (last 3 results) No results for input(s): PROBNP in the last 8760 hours.  Radiological Exams: No  results found.  Assessment/Plan Principal Problem:   Acute on chronic respiratory failure with hypoxia (HCC) Active Problems:   Eaton-Lambert myasthenic syndrome (HCC)   Acute arterial ischemic stroke, multifocal, anterior circulation (HCC)   Dysphagia as late effect of cerebrovascular accident (CVA)   1. Acute on chronic respiratory failure hypoxiapatient remains on aerosol trach collar 28% has a goal of 24 hours today we will continue aggressive pulmonary toilet supportive measures.. 2. Eaton-Lambert syndrome seems to show some increase in strength continue with supportive care. 3. Acute stroke patient is at baseline slow improvement 4. Dysphagia slow improvement we will follow   I have personally seen and evaluated the patient, evaluated laboratory and imaging results, formulated the assessment and plan and placed orders. The Patient requires high complexity decision making with multiple systems involvement.  Rounds were done with the Respiratory Therapy Director and Staff therapists and discussed with nursing staff also.  Yevonne Pax, MD [facility] Pulmonary Critical Care Medicine Sleep Medicine

## 2019-09-16 DIAGNOSIS — I63521 Cerebral infarction due to unspecified occlusion or stenosis of right anterior cerebral artery: Secondary | ICD-10-CM | POA: Diagnosis not present

## 2019-09-16 DIAGNOSIS — G7080 Lambert-Eaton syndrome, unspecified: Secondary | ICD-10-CM | POA: Diagnosis not present

## 2019-09-16 DIAGNOSIS — J9621 Acute and chronic respiratory failure with hypoxia: Secondary | ICD-10-CM | POA: Diagnosis not present

## 2019-09-16 DIAGNOSIS — I69391 Dysphagia following cerebral infarction: Secondary | ICD-10-CM | POA: Diagnosis not present

## 2019-09-16 LAB — BASIC METABOLIC PANEL
Anion gap: 12 (ref 5–15)
BUN: 13 mg/dL (ref 6–20)
CO2: 27 mmol/L (ref 22–32)
Calcium: 9.8 mg/dL (ref 8.9–10.3)
Chloride: 98 mmol/L (ref 98–111)
Creatinine, Ser: 0.37 mg/dL — ABNORMAL LOW (ref 0.61–1.24)
GFR calc Af Amer: >60 mL/min (ref >60)
GFR calc non Af Amer: >60 mL/min (ref >60)
Glucose, Bld: 126 mg/dL — ABNORMAL HIGH (ref 70–99)
Potassium: 4.6 mmol/L (ref 3.5–5.1)
Sodium: 137 mmol/L (ref 135–145)

## 2019-09-16 LAB — CBC
HCT: 40 % (ref 39.0–52.0)
Hemoglobin: 12.2 g/dL — ABNORMAL LOW (ref 13.0–17.0)
MCH: 20.9 pg — ABNORMAL LOW (ref 26.0–34.0)
MCHC: 30.5 g/dL (ref 30.0–36.0)
MCV: 68.4 fL — ABNORMAL LOW (ref 80.0–100.0)
Platelets: 351 10*3/uL (ref 150–400)
RBC: 5.85 MIL/uL — ABNORMAL HIGH (ref 4.22–5.81)
RDW: 18.4 % — ABNORMAL HIGH (ref 11.5–15.5)
WBC: 12.6 10*3/uL — ABNORMAL HIGH (ref 4.0–10.5)
nRBC: 0 % (ref 0.0–0.2)

## 2019-09-16 LAB — MAGNESIUM: Magnesium: 2.2 mg/dL (ref 1.7–2.4)

## 2019-09-16 NOTE — Progress Notes (Addendum)
Pulmonary Critical Care Medicine [facility] [place]   PULMONARY CRITICAL CARE SERVICE  PROGRESS NOTE  Date of Service: 09/16/2019  Cornell Bourbon  CVE:[id number]  DOB: 1975/06/08   DOA: 09/02/2019  Referring Physician: Carron Curie, MD  HPI: Bernard Hudson is a 44 y.o. male seen for follow up of Acute on Chronic Respiratory Failure.  Patient remains on aerosol trach collar 28% FiO2 using PMV with no difficulty.  Medications: Reviewed on Rounds  Physical Exam:  Vitals: Pulse 75 respirations 28 BP 130/90 O2 sat 99% temp 96.5  Ventilator Settings ATC 28%  . General: Comfortable at this time . Eyes: Grossly normal lids, irises & conjunctiva . ENT: grossly tongue is normal . Neck: no obvious mass . Cardiovascular: S1 S2 normal no gallop . Respiratory: No rales . Abdomen: soft . Skin: no rash seen on limited exam . Musculoskeletal: not rigid . Psychiatric:unable to assess . Neurologic: no seizure no involuntary movements         Lab Data:   Basic Metabolic Panel: Recent Labs  Lab 09/10/19 0659 09/16/19 1655  NA 136 137  K 4.1 4.6  CL 99 98  CO2 24 27  GLUCOSE 98 126*  BUN 19 13  CREATININE 0.34* 0.37*  CALCIUM 9.6 9.8  MG  --  2.2    ABG: Recent Labs  Lab 09/15/19 1045  PHART 7.434  PCO2ART 42.1  PO2ART 88.7  HCO3 27.9  O2SAT 97.5    Liver Function Tests: No results for input(s): AST, ALT, ALKPHOS, BILITOT, PROT, ALBUMIN in the last 168 hours. No results for input(s): LIPASE, AMYLASE in the last 168 hours. No results for input(s): AMMONIA in the last 168 hours.  CBC: Recent Labs  Lab 09/10/19 0659 09/16/19 1655  WBC 9.6 12.6*  HGB 10.3* 12.2*  HCT 33.6* 40.0  MCV 68.4* 68.4*  PLT 287 351    Cardiac Enzymes: No results for input(s): CKTOTAL, CKMB, CKMBINDEX, TROPONINI in the last 168 hours.  BNP (last 3 results) No results for input(s): BNP in the last 8760 hours.  ProBNP (last 3 results) No results for input(s): PROBNP in the  last 8760 hours.  Radiological Exams: No results found.  Assessment/Plan Principal Problem:   Acute on chronic respiratory failure with hypoxia (HCC) Active Problems:   Eaton-Lambert myasthenic syndrome (HCC)   Acute arterial ischemic stroke, multifocal, anterior circulation (HCC)   Dysphagia as late effect of cerebrovascular accident (CVA)   1. Acute on chronic respiratory failure hypoxiapatient remains on aerosol trach collar 28% has a goal of24 hours today we will continue aggressive pulmonary toilet supportive measures.. 2. Eaton-Lambert syndrome seems to show some increase in strength continue with supportive care. 3. Acute stroke patient is at baseline slow improvement 4. Dysphagia slow improvement we will follow   I have personally seen and evaluated the patient, evaluated laboratory and imaging results, formulated the assessment and plan and placed orders. The Patient requires high complexity decision making with multiple systems involvement.  Rounds were done with the Respiratory Therapy Director and Staff therapists and discussed with nursing staff also.  Yevonne Pax, MD [facility] Pulmonary Critical Care Medicine Sleep Medicine

## 2019-09-17 DIAGNOSIS — J9621 Acute and chronic respiratory failure with hypoxia: Secondary | ICD-10-CM | POA: Diagnosis not present

## 2019-09-17 DIAGNOSIS — G7080 Lambert-Eaton syndrome, unspecified: Secondary | ICD-10-CM | POA: Diagnosis not present

## 2019-09-17 DIAGNOSIS — I69391 Dysphagia following cerebral infarction: Secondary | ICD-10-CM | POA: Diagnosis not present

## 2019-09-17 DIAGNOSIS — I63521 Cerebral infarction due to unspecified occlusion or stenosis of right anterior cerebral artery: Secondary | ICD-10-CM | POA: Diagnosis not present

## 2019-09-17 NOTE — Progress Notes (Signed)
Pulmonary Critical Care Medicine [facility] [place]   PULMONARY CRITICAL CARE SERVICE  PROGRESS NOTE  Date of Service: 09/17/2019  Bernard Hudson  [id number]  DOB: [birth year]   DOA: 09/02/2019  Referring Physician: Carron Curie, MD  HPI: Bernard Hudson is a 44 y.o. male seen for follow up of Acute on Chronic Respiratory Failure.  Patient is on T collar has been on PMV with good saturations he is doing much better Liberated from the ventilator  Medications: Reviewed on Rounds  Physical Exam:  Vitals: Temperature is 96.7 pulse 75 respiratory 31 blood pressure is 129/82 saturations 100%  Ventilator Settings on T collar with PMV   General: Comfortable at this time  Eyes: Grossly normal lids, irises & conjunctiva  ENT: grossly tongue is normal  Neck: no obvious mass  Cardiovascular: S1 S2 normal no gallop  Respiratory: No rhonchi no rales are noted at this time  Abdomen: soft  Skin: no rash seen on limited exam  Musculoskeletal: not rigid  Psychiatric:unable to assess  Neurologic: no seizure no involuntary movements         Lab Data:   Basic Metabolic Panel: Recent Labs  Lab 09/16/19 1655  NA 137  K 4.6  CL 98  CO2 27  GLUCOSE 126*  BUN 13  CREATININE 0.37*  CALCIUM 9.8  MG 2.2    ABG: Recent Labs  Lab 09/15/19 1045  PHART 7.434  PCO2ART 42.1  PO2ART 88.7  HCO3 27.9  O2SAT 97.5    Liver Function Tests: No results for input(s): AST, ALT, ALKPHOS, BILITOT, PROT, ALBUMIN in the last 168 hours. No results for input(s): LIPASE, AMYLASE in the last 168 hours. No results for input(s): AMMONIA in the last 168 hours.  CBC: Recent Labs  Lab 09/16/19 1655  WBC 12.6*  HGB 12.2*  HCT 40.0  MCV 68.4*  PLT 351    Cardiac Enzymes: No results for input(s): CKTOTAL, CKMB, CKMBINDEX, TROPONINI in the last 168 hours.  BNP (last 3 results) No results for input(s): BNP in the last 8760 hours.  ProBNP (last 3 results) No results for  input(s): PROBNP in the last 8760 hours.  Radiological Exams: No results found.  Assessment/Plan Principal Problem:   Acute on chronic respiratory failure with hypoxia (HCC) Active Problems:   Eaton-Lambert myasthenic syndrome (HCC)   Acute arterial ischemic stroke, multifocal, anterior circulation (HCC)   Dysphagia as late effect of cerebrovascular accident (CVA)   1. Acute on chronic respiratory failure hypoxia we will continue with T collar and continue with PMV hopefully be able to switch at respiratory to start capping trials 2. Eaton-Lambert myasthenia supportive care we will continue with supportive care 3. Acute stroke no change 4. Dysphagia slow improvement we will continue to monitor   I have personally seen and evaluated the patient, evaluated laboratory and imaging results, formulated the assessment and plan and placed orders. The Patient requires high complexity decision making with multiple systems involvement.  Rounds were done with the Respiratory Therapy Director and Staff therapists and discussed with nursing staff also.  Yevonne Pax, MD [facility] Pulmonary Critical Care Medicine Sleep Medicine

## 2019-09-18 DIAGNOSIS — I63521 Cerebral infarction due to unspecified occlusion or stenosis of right anterior cerebral artery: Secondary | ICD-10-CM | POA: Diagnosis not present

## 2019-09-18 DIAGNOSIS — G7080 Lambert-Eaton syndrome, unspecified: Secondary | ICD-10-CM | POA: Diagnosis not present

## 2019-09-18 DIAGNOSIS — J9621 Acute and chronic respiratory failure with hypoxia: Secondary | ICD-10-CM | POA: Diagnosis not present

## 2019-09-18 DIAGNOSIS — I69391 Dysphagia following cerebral infarction: Secondary | ICD-10-CM | POA: Diagnosis not present

## 2019-09-18 NOTE — Progress Notes (Signed)
Pulmonary Critical Care Medicine [facility] [place]   PULMONARY CRITICAL CARE SERVICE  PROGRESS NOTE  Date of Service: 09/18/2019  Bernard Hudson  [id number]  DOB: 1975-04-06   DOA: 09/02/2019  Referring Physician: Carron Curie, MD  HPI: Bernard Hudson is a 44 y.o. male seen for follow up of Acute on Chronic Respiratory Failure.  Patient is comfortable right now without distress at this time no fevers  Medications: Reviewed on Rounds  Physical Exam:  Vitals: Temperature is 97.5 pulse 94 respiratory 21 blood pressure is 118/79 saturations 100%  Ventilator Settings on T collar off the ventilator  . General: Comfortable at this time . Eyes: Grossly normal lids, irises & conjunctiva . ENT: grossly tongue is normal . Neck: no obvious mass . Cardiovascular: S1 S2 normal no gallop . Respiratory: No rhonchi no rales are noted at this time . Abdomen: soft . Skin: no rash seen on limited exam . Musculoskeletal: not rigid . Psychiatric:unable to assess . Neurologic: no seizure no involuntary movements         Lab Data:   Basic Metabolic Panel: Recent Labs  Lab 09/16/19 1655  NA 137  K 4.6  CL 98  CO2 27  GLUCOSE 126*  BUN 13  CREATININE 0.37*  CALCIUM 9.8  MG 2.2    ABG: Recent Labs  Lab 09/15/19 1045  PHART 7.434  PCO2ART 42.1  PO2ART 88.7  HCO3 27.9  O2SAT 97.5    Liver Function Tests: No results for input(s): AST, ALT, ALKPHOS, BILITOT, PROT, ALBUMIN in the last 168 hours. No results for input(s): LIPASE, AMYLASE in the last 168 hours. No results for input(s): AMMONIA in the last 168 hours.  CBC: Recent Labs  Lab 09/16/19 1655  WBC 12.6*  HGB 12.2*  HCT 40.0  MCV 68.4*  PLT 351    Cardiac Enzymes: No results for input(s): CKTOTAL, CKMB, CKMBINDEX, TROPONINI in the last 168 hours.  BNP (last 3 results) No results for input(s): BNP in the last 8760 hours.  ProBNP (last 3 results) No results for input(s): PROBNP in the last 8760  hours.  Radiological Exams: No results found.  Assessment/Plan Principal Problem:   Acute on chronic respiratory failure with hypoxia (HCC) Active Problems:   Eaton-Lambert myasthenic syndrome (HCC)   Acute arterial ischemic stroke, multifocal, anterior circulation (HCC)   Dysphagia as late effect of cerebrovascular accident (CVA)   1. Acute on chronic respiratory failure hypoxia continue with T collar with PMV slowly advanced towards capping 2. Eaton-Lambert syndrome slow improvement 3. Acute stroke no change 4. Dysphagia patient is at baseline we will continue with supportive care   I have personally seen and evaluated the patient, evaluated laboratory and imaging results, formulated the assessment and plan and placed orders. The Patient requires high complexity decision making with multiple systems involvement.  Rounds were done with the Respiratory Therapy Director and Staff therapists and discussed with nursing staff also.  Yevonne Pax, MD [facility] Pulmonary Critical Care Medicine Sleep Medicine

## 2019-09-19 DIAGNOSIS — I69391 Dysphagia following cerebral infarction: Secondary | ICD-10-CM | POA: Diagnosis not present

## 2019-09-19 DIAGNOSIS — I63521 Cerebral infarction due to unspecified occlusion or stenosis of right anterior cerebral artery: Secondary | ICD-10-CM | POA: Diagnosis not present

## 2019-09-19 DIAGNOSIS — G7080 Lambert-Eaton syndrome, unspecified: Secondary | ICD-10-CM | POA: Diagnosis not present

## 2019-09-19 DIAGNOSIS — J9621 Acute and chronic respiratory failure with hypoxia: Secondary | ICD-10-CM | POA: Diagnosis not present

## 2019-09-19 NOTE — Progress Notes (Signed)
Pulmonary Critical Care Medicine [facility] [place]   PULMONARY CRITICAL CARE SERVICE  PROGRESS NOTE  Date of Service: 09/19/2019  Bernard Hudson  [id number]  DOB: [birth year]   DOA: 09/02/2019  Referring Physician: Carron Curie, MD  HPI: Bernard Hudson is a 44 y.o. male seen for follow up of Acute on Chronic Respiratory Failure.  Patient currently is on T collar has been doing fairly well tolerating PMV with liberated from the ventilator  Medications: Reviewed on Rounds  Physical Exam:  Vitals: Temperature is 96.7 pulse 82 respiratory 24 blood pressure is 127/76 saturations 100%  Ventilator Settings on T collar right now with PMV   General: Comfortable at this time  Eyes: Grossly normal lids, irises & conjunctiva  ENT: grossly tongue is normal  Neck: no obvious mass  Cardiovascular: S1 S2 normal no gallop  Respiratory: No rhonchi no rales are noted at this time  Abdomen: soft  Skin: no rash seen on limited exam  Musculoskeletal: not rigid  Psychiatric:unable to assess  Neurologic: no seizure no involuntary movements         Lab Data:   Basic Metabolic Panel: Recent Labs  Lab 09/16/19 1655  NA 137  K 4.6  CL 98  CO2 27  GLUCOSE 126*  BUN 13  CREATININE 0.37*  CALCIUM 9.8  MG 2.2    ABG: Recent Labs  Lab 09/15/19 1045  PHART 7.434  PCO2ART 42.1  PO2ART 88.7  HCO3 27.9  O2SAT 97.5    Liver Function Tests: No results for input(s): AST, ALT, ALKPHOS, BILITOT, PROT, ALBUMIN in the last 168 hours. No results for input(s): LIPASE, AMYLASE in the last 168 hours. No results for input(s): AMMONIA in the last 168 hours.  CBC: Recent Labs  Lab 09/16/19 1655  WBC 12.6*  HGB 12.2*  HCT 40.0  MCV 68.4*  PLT 351    Cardiac Enzymes: No results for input(s): CKTOTAL, CKMB, CKMBINDEX, TROPONINI in the last 168 hours.  BNP (last 3 results) No results for input(s): BNP in the last 8760 hours.  ProBNP (last 3 results) No results  for input(s): PROBNP in the last 8760 hours.  Radiological Exams: No results found.  Assessment/Plan Principal Problem:   Acute on chronic respiratory failure with hypoxia (HCC) Active Problems:   Eaton-Lambert myasthenic syndrome (HCC)   Acute arterial ischemic stroke, multifocal, anterior circulation (HCC)   Dysphagia as late effect of cerebrovascular accident (CVA)   1. Acute on chronic respiratory failure hypoxia plan is to continue to advance the weaning patient should be able to start with capping trials 2. Eaton-Lambert myasthenia no change supportive care patient has overall shown significant improvement since admission 3. Acute stroke anticirculation continue with supportive care therapy 4. Dysphagia we will continue to monitor   I have personally seen and evaluated the patient, evaluated laboratory and imaging results, formulated the assessment and plan and placed orders. The Patient requires high complexity decision making with multiple systems involvement.  Rounds were done with the Respiratory Therapy Director and Staff therapists and discussed with nursing staff also.  Yevonne Pax, MD [facility] Pulmonary Critical Care Medicine Sleep Medicine

## 2019-09-20 DIAGNOSIS — G7080 Lambert-Eaton syndrome, unspecified: Secondary | ICD-10-CM | POA: Diagnosis not present

## 2019-09-20 DIAGNOSIS — J9621 Acute and chronic respiratory failure with hypoxia: Secondary | ICD-10-CM | POA: Diagnosis not present

## 2019-09-20 DIAGNOSIS — I69391 Dysphagia following cerebral infarction: Secondary | ICD-10-CM | POA: Diagnosis not present

## 2019-09-20 DIAGNOSIS — I63521 Cerebral infarction due to unspecified occlusion or stenosis of right anterior cerebral artery: Secondary | ICD-10-CM | POA: Diagnosis not present

## 2019-09-20 NOTE — Progress Notes (Addendum)
Pulmonary Critical Care Medicine [facility] [place]   PULMONARY CRITICAL CARE SERVICE  PROGRESS NOTE  Date of Service: 09/20/2019  Bernard Hudson  [id number]  DOB: [birth year]   DOA: 09/02/2019  Referring Physician: Carron Curie, MD  HPI: Bernard Hudson is a 44 y.o. male seen for follow up of Acute on Chronic Respiratory Failure. Patient remains capped on 1 liter [place] for 24 hours.  No fever or distress noted.   Medications: Reviewed on Rounds  Physical Exam:  Vitals: pulse 83, resp 15, bp 106/76, o2 sat 100%, temp 96.5  Ventilator Settings not currently on vent  . General: Comfortable at this time . Eyes: Grossly normal lids, irises & conjunctiva . ENT: grossly tongue is normal . Neck: no obvious mass . Cardiovascular: S1 S2 normal no gallop . Respiratory: no rales or ronchi noted . Abdomen: soft . Skin: no rash seen on limited exam . Musculoskeletal: not rigid . Psychiatric:unable to assess . Neurologic: no seizure no involuntary movements         Lab Data:   Basic Metabolic Panel: Recent Labs  Lab 09/16/19 1655  NA 137  K 4.6  CL 98  CO2 27  GLUCOSE 126*  BUN 13  CREATININE 0.37*  CALCIUM 9.8  MG 2.2    ABG: Recent Labs  Lab 09/15/19 1045  PHART 7.434  PCO2ART 42.1  PO2ART 88.7  HCO3 27.9  O2SAT 97.5    Liver Function Tests: No results for input(s): AST, ALT, ALKPHOS, BILITOT, PROT, ALBUMIN in the last 168 hours. No results for input(s): LIPASE, AMYLASE in the last 168 hours. No results for input(s): AMMONIA in the last 168 hours.  CBC: Recent Labs  Lab 09/16/19 1655  WBC 12.6*  HGB 12.2*  HCT 40.0  MCV 68.4*  PLT 351    Cardiac Enzymes: No results for input(s): CKTOTAL, CKMB, CKMBINDEX, TROPONINI in the last 168 hours.  BNP (last 3 results) No results for input(s): BNP in the last 8760 hours.  ProBNP (last 3 results) No results for input(s): PROBNP in the last 8760 hours.  Radiological Exams: No results  found.  Assessment/Plan Principal Problem:   Acute on chronic respiratory failure with hypoxia (HCC) Active Problems:   Eaton-Lambert myasthenic syndrome (HCC)   Acute arterial ischemic stroke, multifocal, anterior circulation (HCC)   Dysphagia as late effect of cerebrovascular accident (CVA)   1. Acute on chronic respiratory failure hypoxia plan is to continue with capping trials.  Continue supportive measures and pulmonary toilet.  2. Eaton-Lambert myasthenia no change supportive care patient has overall shown significant improvement since admission 3. Acute stroke anticirculation continue with supportive care therapy 4. Dysphagia we will continue to monitor   I have personally seen and evaluated the patient, evaluated laboratory and imaging results, formulated the assessment and plan and placed orders. The Patient requires high complexity decision making with multiple systems involvement.  Rounds were done with the Respiratory Therapy Director and Staff therapists and discussed with nursing staff also.  Yevonne Pax, MD [facility] Pulmonary Critical Care Medicine Sleep Medicine

## 2019-09-21 ENCOUNTER — Other Ambulatory Visit (HOSPITAL_COMMUNITY): Payer: BLUE CROSS/BLUE SHIELD

## 2019-09-21 DIAGNOSIS — G7080 Lambert-Eaton syndrome, unspecified: Secondary | ICD-10-CM | POA: Diagnosis not present

## 2019-09-21 DIAGNOSIS — J9621 Acute and chronic respiratory failure with hypoxia: Secondary | ICD-10-CM | POA: Diagnosis not present

## 2019-09-21 DIAGNOSIS — I63521 Cerebral infarction due to unspecified occlusion or stenosis of right anterior cerebral artery: Secondary | ICD-10-CM | POA: Diagnosis not present

## 2019-09-21 DIAGNOSIS — I69391 Dysphagia following cerebral infarction: Secondary | ICD-10-CM | POA: Diagnosis not present

## 2019-09-21 NOTE — Progress Notes (Addendum)
Pulmonary Critical Care Medicine [facility] [place]   PULMONARY CRITICAL CARE SERVICE  PROGRESS NOTE  Date of Service: 09/21/2019  Bernard Hudson  [id number]  DOB: [birth year]   DOA: 09/02/2019  Referring Physician: Carron Curie, MD  HPI: Bernard Hudson is a 44 y.o. male seen for follow up of Acute on Chronic Respiratory Failure. Patient remains on room air capped for 48 hours.    Medications: Reviewed on Rounds  Physical Exam:  Vitals: pulse 63, resp 15, bp 122/52, sat 98%, temp 98.1  Ventilator Settings not currently on vent  . General: Comfortable at this time . Eyes: Grossly normal lids, irises & conjunctiva . ENT: grossly tongue is normal . Neck: no obvious mass . Cardiovascular: S1 S2 normal no gallop . Respiratory: no rales or ronchi noted.  . Abdomen: soft . Skin: no rash seen on limited exam . Musculoskeletal: not rigid . Psychiatric:unable to assess . Neurologic: no seizure no involuntary movements         Lab Data:   Basic Metabolic Panel: Recent Labs  Lab 09/16/19 1655  NA 137  K 4.6  CL 98  CO2 27  GLUCOSE 126*  BUN 13  CREATININE 0.37*  CALCIUM 9.8  MG 2.2    ABG: Recent Labs  Lab 09/15/19 1045  PHART 7.434  PCO2ART 42.1  PO2ART 88.7  HCO3 27.9  O2SAT 97.5    Liver Function Tests: No results for input(s): AST, ALT, ALKPHOS, BILITOT, PROT, ALBUMIN in the last 168 hours. No results for input(s): LIPASE, AMYLASE in the last 168 hours. No results for input(s): AMMONIA in the last 168 hours.  CBC: Recent Labs  Lab 09/16/19 1655  WBC 12.6*  HGB 12.2*  HCT 40.0  MCV 68.4*  PLT 351    Cardiac Enzymes: No results for input(s): CKTOTAL, CKMB, CKMBINDEX, TROPONINI in the last 168 hours.  BNP (last 3 results) No results for input(s): BNP in the last 8760 hours.  ProBNP (last 3 results) No results for input(s): PROBNP in the last 8760 hours.  Radiological Exams: No results found.  Assessment/Plan Principal  Problem:   Acute on chronic respiratory failure with hypoxia (HCC) Active Problems:   Eaton-Lambert myasthenic syndrome (HCC)   Acute arterial ischemic stroke, multifocal, anterior circulation (HCC)   Dysphagia as late effect of cerebrovascular accident (CVA)   1. Acute on chronic respiratory failure hypoxia plan is to continue with capping trials.  Continue supportive measures and pulmonary toilet.  2. Eaton-Lambert myasthenia no change supportive care patient has overall shown significant improvement since admission 3. Acute stroke anticirculation continue with supportive care therapy 4. Dysphagia we will continue to monitor   I have personally seen and evaluated the patient, evaluated laboratory and imaging results, formulated the assessment and plan and placed orders. The Patient requires high complexity decision making with multiple systems involvement.  Rounds were done with the Respiratory Therapy Director and Staff therapists and discussed with nursing staff also.  Yevonne Pax, MD [facility] Pulmonary Critical Care Medicine Sleep Medicine

## 2019-09-22 ENCOUNTER — Other Ambulatory Visit (HOSPITAL_COMMUNITY): Payer: BLUE CROSS/BLUE SHIELD

## 2019-09-22 DIAGNOSIS — I63521 Cerebral infarction due to unspecified occlusion or stenosis of right anterior cerebral artery: Secondary | ICD-10-CM | POA: Diagnosis not present

## 2019-09-22 DIAGNOSIS — J9621 Acute and chronic respiratory failure with hypoxia: Secondary | ICD-10-CM | POA: Diagnosis not present

## 2019-09-22 DIAGNOSIS — I69391 Dysphagia following cerebral infarction: Secondary | ICD-10-CM | POA: Diagnosis not present

## 2019-09-22 DIAGNOSIS — G7080 Lambert-Eaton syndrome, unspecified: Secondary | ICD-10-CM | POA: Diagnosis not present

## 2019-09-22 NOTE — Progress Notes (Signed)
Pulmonary Critical Care Medicine [facility] [place]   PULMONARY CRITICAL CARE SERVICE  PROGRESS NOTE  Date of Service: 09/22/2019  Bernard Hudson  [id number]  DOB: May 05, 1975   DOA: 09/02/2019  Referring Physician: Carron Curie, MD  HPI: Bernard Hudson is a 44 y.o. male seen for follow up of Acute on Chronic Respiratory Failure.  Patient is capping currently 72 hours have been completed.  We are going to wait for one other day to proceed to decannulation  Medications: Reviewed on Rounds  Physical Exam:  Vitals: Temperature is 96.9 pulse 88 respiratory rate 15 blood pressure is 109/69 saturations 99%  Ventilator Settings capping  . General: Comfortable at this time . Eyes: Grossly normal lids, irises & conjunctiva . ENT: grossly tongue is normal . Neck: no obvious mass . Cardiovascular: S1 S2 normal no gallop . Respiratory: No rhonchi no rales are noted at this time . Abdomen: soft . Skin: no rash seen on limited exam . Musculoskeletal: not rigid . Psychiatric:unable to assess . Neurologic: no seizure no involuntary movements         Lab Data:   Basic Metabolic Panel: Recent Labs  Lab 09/16/19 1655  NA 137  K 4.6  CL 98  CO2 27  GLUCOSE 126*  BUN 13  CREATININE 0.37*  CALCIUM 9.8  MG 2.2    ABG: Recent Labs  Lab 09/15/19 1045  PHART 7.434  PCO2ART 42.1  PO2ART 88.7  HCO3 27.9  O2SAT 97.5    Liver Function Tests: No results for input(s): AST, ALT, ALKPHOS, BILITOT, PROT, ALBUMIN in the last 168 hours. No results for input(s): LIPASE, AMYLASE in the last 168 hours. No results for input(s): AMMONIA in the last 168 hours.  CBC: Recent Labs  Lab 09/16/19 1655  WBC 12.6*  HGB 12.2*  HCT 40.0  MCV 68.4*  PLT 351    Cardiac Enzymes: No results for input(s): CKTOTAL, CKMB, CKMBINDEX, TROPONINI in the last 168 hours.  BNP (last 3 results) No results for input(s): BNP in the last 8760 hours.  ProBNP (last 3 results) No results  for input(s): PROBNP in the last 8760 hours.  Radiological Exams: No results found.  Assessment/Plan Principal Problem:   Acute on chronic respiratory failure with hypoxia (HCC) Active Problems:   Eaton-Lambert myasthenic syndrome (HCC)   Acute arterial ischemic stroke, multifocal, anterior circulation (HCC)   Dysphagia as late effect of cerebrovascular accident (CVA)   1. Acute on chronic respiratory failure with hypoxia we will continue with the capping today's will be completing 72 hours 2. Eaton-Lambert slow improvement continue with supportive care 3. Acute stroke will need ongoing therapy 4. Dysphagia no change continue with supportive care   I have personally seen and evaluated the patient, evaluated laboratory and imaging results, formulated the assessment and plan and placed orders. The Patient requires high complexity decision making with multiple systems involvement.  Rounds were done with the Respiratory Therapy Director and Staff therapists and discussed with nursing staff also.  Yevonne Pax, MD [facility] Pulmonary Critical Care Medicine Sleep Medicine

## 2019-09-23 DIAGNOSIS — J9621 Acute and chronic respiratory failure with hypoxia: Secondary | ICD-10-CM | POA: Diagnosis not present

## 2019-09-23 DIAGNOSIS — I63521 Cerebral infarction due to unspecified occlusion or stenosis of right anterior cerebral artery: Secondary | ICD-10-CM | POA: Diagnosis not present

## 2019-09-23 DIAGNOSIS — I69391 Dysphagia following cerebral infarction: Secondary | ICD-10-CM | POA: Diagnosis not present

## 2019-09-23 DIAGNOSIS — G7080 Lambert-Eaton syndrome, unspecified: Secondary | ICD-10-CM | POA: Diagnosis not present

## 2019-09-23 NOTE — Progress Notes (Addendum)
Pulmonary Critical Care Medicine [facility] [place]   PULMONARY CRITICAL CARE SERVICE  PROGRESS NOTE  Date of Service: 09/23/2019  Trae Bovenzi  MCN:[id number]  DOB: 1975/07/01   DOA: 09/02/2019  Referring Physician: Carron Curie, MD  HPI: Lathaniel Legate is a 44 y.o. male seen for follow up of Acute on Chronic Respiratory Failure.  Patient has been capped for 4 days satting well no fever distress.  Medications: Reviewed on Rounds  Physical Exam:  Vitals: Pulse 85 respirations 20 BP 107/64 O2 sat 99% temp 96.7  Ventilator Settings not currently on vent  . General: Comfortable at this time . Eyes: Grossly normal lids, irises & conjunctiva . ENT: grossly tongue is normal . Neck: no obvious mass . Cardiovascular: S1 S2 normal no gallop . Respiratory: No rales or rhonchi noted . Abdomen: soft . Skin: no rash seen on limited exam . Musculoskeletal: not rigid . Psychiatric:unable to assess . Neurologic: no seizure no involuntary movements         Lab Data:   Basic Metabolic Panel: No results for input(s): NA, K, CL, CO2, GLUCOSE, BUN, CREATININE, CALCIUM, MG, PHOS in the last 168 hours.  ABG: No results for input(s): PHART, PCO2ART, PO2ART, HCO3, O2SAT in the last 168 hours.  Liver Function Tests: No results for input(s): AST, ALT, ALKPHOS, BILITOT, PROT, ALBUMIN in the last 168 hours. No results for input(s): LIPASE, AMYLASE in the last 168 hours. No results for input(s): AMMONIA in the last 168 hours.  CBC: No results for input(s): WBC, NEUTROABS, HGB, HCT, MCV, PLT in the last 168 hours.  Cardiac Enzymes: No results for input(s): CKTOTAL, CKMB, CKMBINDEX, TROPONINI in the last 168 hours.  BNP (last 3 results) No results for input(s): BNP in the last 8760 hours.  ProBNP (last 3 results) No results for input(s): PROBNP in the last 8760 hours.  Radiological Exams: No results found.  Assessment/Plan Principal Problem:   Acute on chronic  respiratory failure with hypoxia (HCC) Active Problems:   Eaton-Lambert myasthenic syndrome (HCC)   Acute arterial ischemic stroke, multifocal, anterior circulation (HCC)   Dysphagia as late effect of cerebrovascular accident (CVA)   1. Acute on chronic respiratory failure with hypoxia we will continue with the capping today's will be completing 4 days.  Continue supportive measures and pulmonary toilet. 2. Eaton-Lambert slow improvement continue with supportive care 3. Acute stroke will need ongoing therapy 4. Dysphagia no change continue with supportive care   I have personally seen and evaluated the patient, evaluated laboratory and imaging results, formulated the assessment and plan and placed orders. The Patient requires high complexity decision making with multiple systems involvement.  Rounds were done with the Respiratory Therapy Director and Staff therapists and discussed with nursing staff also.  Yevonne Pax, MD [facility] Pulmonary Critical Care Medicine Sleep Medicine

## 2019-09-24 DIAGNOSIS — G7080 Lambert-Eaton syndrome, unspecified: Secondary | ICD-10-CM | POA: Diagnosis not present

## 2019-09-24 DIAGNOSIS — I63521 Cerebral infarction due to unspecified occlusion or stenosis of right anterior cerebral artery: Secondary | ICD-10-CM | POA: Diagnosis not present

## 2019-09-24 DIAGNOSIS — J9621 Acute and chronic respiratory failure with hypoxia: Secondary | ICD-10-CM | POA: Diagnosis not present

## 2019-09-24 DIAGNOSIS — I69391 Dysphagia following cerebral infarction: Secondary | ICD-10-CM | POA: Diagnosis not present

## 2019-09-24 NOTE — Progress Notes (Signed)
Pulmonary Critical Care Medicine [facility] [place]   PULMONARY CRITICAL CARE SERVICE  PROGRESS NOTE  Date of Service: 09/24/2019  Delmont Prosch  [id number]:[id number]  DOB: [birth year]   DOA: 09/02/2019  Referring Physician: Carron Curie, MD  HPI: Kayshaun Polanco is a 44 y.o. male seen for follow up of Acute on Chronic Respiratory Failure.  Right now is capping he is doing better however very slow to improve and regain strength.  Spoke to him about doing incentive spirometry to help with diaphragmatic strengthening exercises.  He is ready for capping removal but again until his respiratory muscles strength is much improved I would hold off.  Medications: Reviewed on Rounds  Physical Exam:  Vitals: Temperature 97.4 pulse 77 respiratory rate 15 blood pressure is 112/62 saturations 98%  Ventilator Settings capping off the ventilator  . General: Comfortable at this time . Eyes: Grossly normal lids, irises & conjunctiva . ENT: grossly tongue is normal . Neck: no obvious mass . Cardiovascular: S1 S2 normal no gallop . Respiratory: No rhonchi no rales . Abdomen: soft . Skin: no rash seen on limited exam . Musculoskeletal: not rigid . Psychiatric:unable to assess . Neurologic: no seizure no involuntary movements         Lab Data:   Basic Metabolic Panel: No results for input(s): NA, K, CL, CO2, GLUCOSE, BUN, CREATININE, CALCIUM, MG, PHOS in the last 168 hours.  ABG: No results for input(s): PHART, PCO2ART, PO2ART, HCO3, O2SAT in the last 168 hours.  Liver Function Tests: No results for input(s): AST, ALT, ALKPHOS, BILITOT, PROT, ALBUMIN in the last 168 hours. No results for input(s): LIPASE, AMYLASE in the last 168 hours. No results for input(s): AMMONIA in the last 168 hours.  CBC: No results for input(s): WBC, NEUTROABS, HGB, HCT, MCV, PLT in the last 168 hours.  Cardiac Enzymes: No results for input(s): CKTOTAL, CKMB, CKMBINDEX, TROPONINI in the last 168  hours.  BNP (last 3 results) No results for input(s): BNP in the last 8760 hours.  ProBNP (last 3 results) No results for input(s): PROBNP in the last 8760 hours.  Radiological Exams: No results found.  Assessment/Plan Principal Problem:   Acute on chronic respiratory failure with hypoxia (HCC) Active Problems:   Eaton-Lambert myasthenic syndrome (HCC)   Acute arterial ischemic stroke, multifocal, anterior circulation (HCC)   Dysphagia as late effect of cerebrovascular accident (CVA)   1. Acute on chronic respiratory failure with hypoxia patient continues to do fine capping trials.  I would do respiratory muscle training using incentive spirometer.  Patient is doing slowly  2. Eaton-Lambert syndrome showing some improvement we will supportive care 3. acute stroke supportive care physical therapy as tolerated 4. dysphagia patient had a swallowing assessment which he failed   I have personally seen and evaluated the patient, evaluated laboratory and imaging results, formulated the assessment and plan and placed orders. The Patient requires high complexity decision making with multiple systems involvement.  Rounds were done with the Respiratory Therapy Director and Staff therapists and discussed with nursing staff also.  Yevonne Pax, MD [facility] Pulmonary Critical Care Medicine Sleep Medicine

## 2019-09-25 DIAGNOSIS — J9621 Acute and chronic respiratory failure with hypoxia: Secondary | ICD-10-CM | POA: Diagnosis not present

## 2019-09-25 DIAGNOSIS — I63521 Cerebral infarction due to unspecified occlusion or stenosis of right anterior cerebral artery: Secondary | ICD-10-CM | POA: Diagnosis not present

## 2019-09-25 DIAGNOSIS — G7080 Lambert-Eaton syndrome, unspecified: Secondary | ICD-10-CM | POA: Diagnosis not present

## 2019-09-25 DIAGNOSIS — I69391 Dysphagia following cerebral infarction: Secondary | ICD-10-CM | POA: Diagnosis not present

## 2019-09-25 NOTE — Progress Notes (Signed)
Pulmonary Critical Care Medicine [facility] [place]   PULMONARY CRITICAL CARE SERVICE  PROGRESS NOTE  Date of Service: 09/25/2019  Bernard Hudson  SVX:[id number]  DOB: 04-17-1975   DOA: 09/02/2019  Referring Physician: Carron Curie, MD  HPI: Bernard Hudson is a 44 y.o. male seen for follow up of Acute on Chronic Respiratory Failure.  Patient at this time is capping on room air good saturations are noted  Medications: Reviewed on Rounds  Physical Exam:  Vitals: Temperature is 97.4 pulse 79 respiratory rate 25 blood pressure is 103/60 saturations 99%  Ventilator Settings capping on room air  . General: Comfortable at this time . Eyes: Grossly normal lids, irises & conjunctiva . ENT: grossly tongue is normal . Neck: no obvious mass . Cardiovascular: S1 S2 normal no gallop . Respiratory: No rhonchi very coarse breath sounds . Abdomen: soft . Skin: no rash seen on limited exam . Musculoskeletal: not rigid . Psychiatric:unable to assess . Neurologic: no seizure no involuntary movements         Lab Data:   Basic Metabolic Panel: No results for input(s): NA, K, CL, CO2, GLUCOSE, BUN, CREATININE, CALCIUM, MG, PHOS in the last 168 hours.  ABG: No results for input(s): PHART, PCO2ART, PO2ART, HCO3, O2SAT in the last 168 hours.  Liver Function Tests: No results for input(s): AST, ALT, ALKPHOS, BILITOT, PROT, ALBUMIN in the last 168 hours. No results for input(s): LIPASE, AMYLASE in the last 168 hours. No results for input(s): AMMONIA in the last 168 hours.  CBC: No results for input(s): WBC, NEUTROABS, HGB, HCT, MCV, PLT in the last 168 hours.  Cardiac Enzymes: No results for input(s): CKTOTAL, CKMB, CKMBINDEX, TROPONINI in the last 168 hours.  BNP (last 3 results) No results for input(s): BNP in the last 8760 hours.  ProBNP (last 3 results) No results for input(s): PROBNP in the last 8760 hours.  Radiological Exams: No results  found.  Assessment/Plan Principal Problem:   Acute on chronic respiratory failure with hypoxia (HCC) Active Problems:   Eaton-Lambert myasthenic syndrome (HCC)   Acute arterial ischemic stroke, multifocal, anterior circulation (HCC)   Dysphagia as late effect of cerebrovascular accident (CVA)   1. Acute on chronic respiratory failure with hypoxia we will continue with capping trials as ordered continue secretion management supportive care. 2. Eaton-Lambert syndrome slow improvement continue with therapy 3. Acute stroke supportive care therapy as tolerated 4. Dysphagia patient failed swallowing study   I have personally seen and evaluated the patient, evaluated laboratory and imaging results, formulated the assessment and plan and placed orders. The Patient requires high complexity decision making with multiple systems involvement.  Rounds were done with the Respiratory Therapy Director and Staff therapists and discussed with nursing staff also.  Yevonne Pax, MD [facility] Pulmonary Critical Care Medicine Sleep Medicine

## 2019-09-26 DIAGNOSIS — J9621 Acute and chronic respiratory failure with hypoxia: Secondary | ICD-10-CM | POA: Diagnosis not present

## 2019-09-26 DIAGNOSIS — G7080 Lambert-Eaton syndrome, unspecified: Secondary | ICD-10-CM | POA: Diagnosis not present

## 2019-09-26 DIAGNOSIS — I63521 Cerebral infarction due to unspecified occlusion or stenosis of right anterior cerebral artery: Secondary | ICD-10-CM | POA: Diagnosis not present

## 2019-09-26 DIAGNOSIS — I69391 Dysphagia following cerebral infarction: Secondary | ICD-10-CM | POA: Diagnosis not present

## 2019-09-26 NOTE — Progress Notes (Signed)
Pulmonary Critical Care Medicine [facility] [place]   PULMONARY CRITICAL CARE SERVICE  PROGRESS NOTE  Date of Service: 09/26/2019  Markevion Lattin  [id number]:[id number]  DOB: 08-May-1975   DOA: 09/02/2019  Referring Physician: Carron Curie, MD  HPI: Shahrukh Pasch is a 44 y.o. male seen for follow up of Acute on Chronic Respiratory Failure.  Patient is doing fine with capping trials appears to be tolerating it without any issues right now cough is still weak  Medications: Reviewed on Rounds  Physical Exam:  Vitals: Temperature is 96.5 pulse 76 respiratory 11 blood pressure is 97/62 saturations 100%  Ventilator Settings capping of the ventilator  . General: Comfortable at this time . Eyes: Grossly normal lids, irises & conjunctiva . ENT: grossly tongue is normal . Neck: no obvious mass . Cardiovascular: S1 S2 normal no gallop . Respiratory: No rhonchi no rales noted at this time . Abdomen: soft . Skin: no rash seen on limited exam . Musculoskeletal: not rigid . Psychiatric:unable to assess . Neurologic: no seizure no involuntary movements         Lab Data:   Basic Metabolic Panel: No results for input(s): NA, K, CL, CO2, GLUCOSE, BUN, CREATININE, CALCIUM, MG, PHOS in the last 168 hours.  ABG: No results for input(s): PHART, PCO2ART, PO2ART, HCO3, O2SAT in the last 168 hours.  Liver Function Tests: No results for input(s): AST, ALT, ALKPHOS, BILITOT, PROT, ALBUMIN in the last 168 hours. No results for input(s): LIPASE, AMYLASE in the last 168 hours. No results for input(s): AMMONIA in the last 168 hours.  CBC: No results for input(s): WBC, NEUTROABS, HGB, HCT, MCV, PLT in the last 168 hours.  Cardiac Enzymes: No results for input(s): CKTOTAL, CKMB, CKMBINDEX, TROPONINI in the last 168 hours.  BNP (last 3 results) No results for input(s): BNP in the last 8760 hours.  ProBNP (last 3 results) No results for input(s): PROBNP in the last 8760  hours.  Radiological Exams: No results found.  Assessment/Plan Principal Problem:   Acute on chronic respiratory failure with hypoxia (HCC) Active Problems:   Eaton-Lambert myasthenic syndrome (HCC)   Acute arterial ischemic stroke, multifocal, anterior circulation (HCC)   Dysphagia as late effect of cerebrovascular accident (CVA)   1. Acute on chronic respiratory failure with hypoxia we will continue with capping trials awaiting discharge planning we will need to go to rehab With the capping still because with cough 2. Eaton-Lambert syndrome slow improvement we will continue to follow 3. Acute stroke supportive care therapy as tolerated 4. Dysphagia failed attempt at swallowing   I have personally seen and evaluated the patient, evaluated laboratory and imaging results, formulated the assessment and plan and placed orders. The Patient requires high complexity decision making with multiple systems involvement.  Rounds were done with the Respiratory Therapy Director and Staff therapists and discussed with nursing staff also.  Yevonne Pax, MD [facility] Pulmonary Critical Care Medicine Sleep Medicine

## 2019-09-27 DIAGNOSIS — I69391 Dysphagia following cerebral infarction: Secondary | ICD-10-CM | POA: Diagnosis not present

## 2019-09-27 DIAGNOSIS — G7080 Lambert-Eaton syndrome, unspecified: Secondary | ICD-10-CM | POA: Diagnosis not present

## 2019-09-27 DIAGNOSIS — I63521 Cerebral infarction due to unspecified occlusion or stenosis of right anterior cerebral artery: Secondary | ICD-10-CM | POA: Diagnosis not present

## 2019-09-27 DIAGNOSIS — J9621 Acute and chronic respiratory failure with hypoxia: Secondary | ICD-10-CM | POA: Diagnosis not present

## 2019-09-27 NOTE — Progress Notes (Addendum)
Pulmonary Critical Care Medicine [facility] [place]   PULMONARY CRITICAL CARE SERVICE  PROGRESS NOTE  Date of Service: 09/27/2019  Bernard Hudson  [id number]  DOB: 03/15/1975   DOA: 09/02/2019  Referring Physician: Carron Curie, MD  HPI: Bernard Hudson is a 44 y.o. male seen for follow up of Acute on Chronic Respiratory Failure. Patient remain capped, sating well with no distress noted.   Medications: Reviewed on Rounds  Physical Exam:  Vitals: pulse 81, resp 15, bp 100/65, sat 100%, temp 96.9  Ventilator Settings capped on room air  . General: Comfortable at this time . Eyes: Grossly normal lids, irises & conjunctiva . ENT: grossly tongue is normal . Neck: no obvious mass . Cardiovascular: S1 S2 normal no gallop . Respiratory: no rales or ronchi noted . Abdomen: soft . Skin: no rash seen on limited exam . Musculoskeletal: not rigid . Psychiatric:unable to assess . Neurologic: no seizure no involuntary movements         Lab Data:   Basic Metabolic Panel: No results for input(s): NA, K, CL, CO2, GLUCOSE, BUN, CREATININE, CALCIUM, MG, PHOS in the last 168 hours.  ABG: No results for input(s): PHART, PCO2ART, PO2ART, HCO3, O2SAT in the last 168 hours.  Liver Function Tests: No results for input(s): AST, ALT, ALKPHOS, BILITOT, PROT, ALBUMIN in the last 168 hours. No results for input(s): LIPASE, AMYLASE in the last 168 hours. No results for input(s): AMMONIA in the last 168 hours.  CBC: No results for input(s): WBC, NEUTROABS, HGB, HCT, MCV, PLT in the last 168 hours.  Cardiac Enzymes: No results for input(s): CKTOTAL, CKMB, CKMBINDEX, TROPONINI in the last 168 hours.  BNP (last 3 results) No results for input(s): BNP in the last 8760 hours.  ProBNP (last 3 results) No results for input(s): PROBNP in the last 8760 hours.  Radiological Exams: No results found.  Assessment/Plan Principal Problem:   Acute on chronic respiratory failure with  hypoxia (HCC) Active Problems:   Eaton-Lambert myasthenic syndrome (HCC)   Acute arterial ischemic stroke, multifocal, anterior circulation (HCC)   Dysphagia as late effect of cerebrovascular accident (CVA)   1. Acute on chronic respiratory failure with hypoxia we will continue with capping trials awaiting discharge planning we will need to go to rehab With the capping still because with cough  2. Eaton-Lambert syndrome slow improvement we will continue to follow 3. Acute stroke supportive care therapy as tolerated 4. Dysphagia failed attempt at swallowing   I have personally seen and evaluated the patient, evaluated laboratory and imaging results, formulated the assessment and plan and placed orders. The Patient requires high complexity decision making with multiple systems involvement.  Rounds were done with the Respiratory Therapy Director and Staff therapists and discussed with nursing staff also.  Yevonne Pax, MD [facility] Pulmonary Critical Care Medicine Sleep Medicine

## 2019-09-28 ENCOUNTER — Other Ambulatory Visit (HOSPITAL_COMMUNITY): Payer: BLUE CROSS/BLUE SHIELD

## 2019-09-28 DIAGNOSIS — G7080 Lambert-Eaton syndrome, unspecified: Secondary | ICD-10-CM | POA: Diagnosis not present

## 2019-09-28 DIAGNOSIS — J9621 Acute and chronic respiratory failure with hypoxia: Secondary | ICD-10-CM | POA: Diagnosis not present

## 2019-09-28 DIAGNOSIS — I63521 Cerebral infarction due to unspecified occlusion or stenosis of right anterior cerebral artery: Secondary | ICD-10-CM | POA: Diagnosis not present

## 2019-09-28 DIAGNOSIS — I69391 Dysphagia following cerebral infarction: Secondary | ICD-10-CM | POA: Diagnosis not present

## 2019-09-28 LAB — CBC
HCT: 38.6 % — ABNORMAL LOW (ref 39.0–52.0)
Hemoglobin: 11.7 g/dL — ABNORMAL LOW (ref 13.0–17.0)
MCH: 20.5 pg — ABNORMAL LOW (ref 26.0–34.0)
MCHC: 30.3 g/dL (ref 30.0–36.0)
MCV: 67.5 fL — ABNORMAL LOW (ref 80.0–100.0)
Platelets: 310 10*3/uL (ref 150–400)
RBC: 5.72 MIL/uL (ref 4.22–5.81)
RDW: 17.8 % — ABNORMAL HIGH (ref 11.5–15.5)
WBC: 12.1 10*3/uL — ABNORMAL HIGH (ref 4.0–10.5)
nRBC: 0 % (ref 0.0–0.2)

## 2019-09-28 LAB — BASIC METABOLIC PANEL
Anion gap: 9 (ref 5–15)
BUN: 16 mg/dL (ref 6–20)
CO2: 29 mmol/L (ref 22–32)
Calcium: 9.4 mg/dL (ref 8.9–10.3)
Chloride: 99 mmol/L (ref 98–111)
Creatinine, Ser: <0.3 mg/dL — ABNORMAL LOW (ref 0.61–1.24)
Glucose, Bld: 97 mg/dL (ref 70–99)
Potassium: 3.7 mmol/L (ref 3.5–5.1)
Sodium: 137 mmol/L (ref 135–145)

## 2019-09-28 NOTE — Progress Notes (Addendum)
Pulmonary Critical Care Medicine [facility] [place]   PULMONARY CRITICAL CARE SERVICE  PROGRESS NOTE  Date of Service: 09/28/2019  Bernard Hudson  [id number]  DOB: [birth year]   DOA: 09/02/2019  Referring Physician: Carron Curie, MD  HPI: Bernard Hudson is a 44 y.o. male seen for follow up of Acute on Chronic Respiratory Failure.   Patient remains capped on room air at this time satting well no fever distress.  Medications: Reviewed on Rounds  Physical Exam:  Vitals:   Pulse 75 respirations 16 BP 96/57 O2 sat 100% at night 7.0  Ventilator Settings  Not currently on ventilator  . General: Comfortable at this time . Eyes: Grossly normal lids, irises & conjunctiva . ENT: grossly tongue is normal . Neck: no obvious mass . Cardiovascular: S1 S2 normal no gallop . Respiratory:  Coarse breath sounds . Abdomen: soft . Skin: no rash seen on limited exam . Musculoskeletal: not rigid . Psychiatric:unable to assess . Neurologic: no seizure no involuntary movements         Lab Data:   Basic Metabolic Panel: Recent Labs  Lab 09/28/19 1115  NA 137  K 3.7  CL 99  CO2 29  GLUCOSE 97  BUN 16  CREATININE <0.30*  CALCIUM 9.4    ABG: No results for input(s): PHART, PCO2ART, PO2ART, HCO3, O2SAT in the last 168 hours.  Liver Function Tests: No results for input(s): AST, ALT, ALKPHOS, BILITOT, PROT, ALBUMIN in the last 168 hours. No results for input(s): LIPASE, AMYLASE in the last 168 hours. No results for input(s): AMMONIA in the last 168 hours.  CBC: Recent Labs  Lab 09/28/19 1115  WBC 12.1*  HGB 11.7*  HCT 38.6*  MCV 67.5*  PLT 310    Cardiac Enzymes: No results for input(s): CKTOTAL, CKMB, CKMBINDEX, TROPONINI in the last 168 hours.  BNP (last 3 results) No results for input(s): BNP in the last 8760 hours.  ProBNP (last 3 results) No results for input(s): PROBNP in the last 8760 hours.  Radiological Exams: DG CHEST PORT 1 VIEW  Result Date:  09/28/2019 CLINICAL DATA:  Respiratory failure EXAM: PORTABLE CHEST 1 VIEW COMPARISON:  09/10/2019 FINDINGS: Tracheostomy tube that is well seated. Interstitial reticulation at the bases and apical lucency. There is no edema, consolidation, effusion, or pneumothorax. Nipple shadow likely at the right base. There is history of chest CT at outside hospital July 2021, with no mention of pulmonary nodule. Normal heart size and mediastinal contours. IMPRESSION: Stable chest without evidence of active disease. Electronically Signed   By: Marnee Spring M.D.   On: 09/28/2019 05:18    Assessment/Plan Principal Problem:   Acute on chronic respiratory failure with hypoxia (HCC) Active Problems:   Eaton-Lambert myasthenic syndrome (HCC)   Acute arterial ischemic stroke, multifocal, anterior circulation (HCC)   Dysphagia as late effect of cerebrovascular accident (CVA)   1. Acute on chronic respiratory failure with hypoxia we will continue with capping trials awaiting discharge planning we will need to go to rehab With the capping still because with cough  2. Eaton-Lambert syndrome slow improvement we will continue to follow 3. Acute stroke supportive care therapy as tolerated 4. Dysphagia failed attempt at swallowing   I have personally seen and evaluated the patient, evaluated laboratory and imaging results, formulated the assessment and plan and placed orders. The Patient requires high complexity decision making with multiple systems involvement.  Rounds were done with the Respiratory Therapy Director and Staff therapists and discussed with nursing staff  also.  Allyne Gee, MD [facility] Pulmonary Critical Care Medicine Sleep Medicine

## 2019-09-29 DIAGNOSIS — J9621 Acute and chronic respiratory failure with hypoxia: Secondary | ICD-10-CM | POA: Diagnosis not present

## 2019-09-29 DIAGNOSIS — I69391 Dysphagia following cerebral infarction: Secondary | ICD-10-CM | POA: Diagnosis not present

## 2019-09-29 DIAGNOSIS — G7080 Lambert-Eaton syndrome, unspecified: Secondary | ICD-10-CM | POA: Diagnosis not present

## 2019-09-29 DIAGNOSIS — I63521 Cerebral infarction due to unspecified occlusion or stenosis of right anterior cerebral artery: Secondary | ICD-10-CM | POA: Diagnosis not present

## 2019-09-29 NOTE — Progress Notes (Addendum)
Pulmonary Critical Care Medicine [facility] [place]   PULMONARY CRITICAL CARE SERVICE  PROGRESS NOTE  Date of Service: 09/29/2019  Bernard Hudson  [id number]  DOB: [birth year]   DOA: 09/02/2019  Referring Physician: Carron Curie, MD  HPI: Bernard Hudson is a 44 y.o. male seen for follow up of Acute on Chronic Respiratory Failure.  Patient continues to be capped on room air at this time satting well no fever distress.  Medications: Reviewed on Rounds  Physical Exam:  Vitals:   Pulse 75 respirations 22 BP 106/66 O2 sat 98% temp 98.8 degrees  Ventilator Settings  Not currently on ventilator  . General: Comfortable at this time . Eyes: Grossly normal lids, irises & conjunctiva . ENT: grossly tongue is normal . Neck: no obvious mass . Cardiovascular: S1 S2 normal no gallop . Respiratory:  Scattered rhonchi . Abdomen: soft . Skin: no rash seen on limited exam . Musculoskeletal: not rigid . Psychiatric:unable to assess . Neurologic: no seizure no involuntary movements         Lab Data:   Basic Metabolic Panel: Recent Labs  Lab 09/28/19 1115  NA 137  K 3.7  CL 99  CO2 29  GLUCOSE 97  BUN 16  CREATININE <0.30*  CALCIUM 9.4    ABG: No results for input(s): PHART, PCO2ART, PO2ART, HCO3, O2SAT in the last 168 hours.  Liver Function Tests: No results for input(s): AST, ALT, ALKPHOS, BILITOT, PROT, ALBUMIN in the last 168 hours. No results for input(s): LIPASE, AMYLASE in the last 168 hours. No results for input(s): AMMONIA in the last 168 hours.  CBC: Recent Labs  Lab 09/28/19 1115  WBC 12.1*  HGB 11.7*  HCT 38.6*  MCV 67.5*  PLT 310    Cardiac Enzymes: No results for input(s): CKTOTAL, CKMB, CKMBINDEX, TROPONINI in the last 168 hours.  BNP (last 3 results) No results for input(s): BNP in the last 8760 hours.  ProBNP (last 3 results) No results for input(s): PROBNP in the last 8760 hours.  Radiological Exams: DG CHEST PORT 1  VIEW  Result Date: 09/28/2019 CLINICAL DATA:  Respiratory failure EXAM: PORTABLE CHEST 1 VIEW COMPARISON:  09/10/2019 FINDINGS: Tracheostomy tube that is well seated. Interstitial reticulation at the bases and apical lucency. There is no edema, consolidation, effusion, or pneumothorax. Nipple shadow likely at the right base. There is history of chest CT at outside hospital July 2021, with no mention of pulmonary nodule. Normal heart size and mediastinal contours. IMPRESSION: Stable chest without evidence of active disease. Electronically Signed   By: Marnee Spring M.D.   On: 09/28/2019 05:18    Assessment/Plan Principal Problem:   Acute on chronic respiratory failure with hypoxia (HCC) Active Problems:   Eaton-Lambert myasthenic syndrome (HCC)   Acute arterial ischemic stroke, multifocal, anterior circulation (HCC)   Dysphagia as late effect of cerebrovascular accident (CVA)   1. Acute on chronic respiratory failure with hypoxia we will continue with capping trials awaiting discharge planning we will need to go to rehab With the capping still because with cough  2. Eaton-Lambert syndrome slow improvement we will continue to follow 3. Acute stroke supportive care therapy as tolerated 4. Dysphagia failed attempt at swallowing   I have personally seen and evaluated the patient, evaluated laboratory and imaging results, formulated the assessment and plan and placed orders. The Patient requires high complexity decision making with multiple systems involvement.  Rounds were done with the Respiratory Therapy Director and Staff therapists and discussed with nursing staff  also.  Allyne Gee, MD [facility] Pulmonary Critical Care Medicine Sleep Medicine

## 2019-09-30 DIAGNOSIS — I63521 Cerebral infarction due to unspecified occlusion or stenosis of right anterior cerebral artery: Secondary | ICD-10-CM | POA: Diagnosis not present

## 2019-09-30 DIAGNOSIS — I69391 Dysphagia following cerebral infarction: Secondary | ICD-10-CM | POA: Diagnosis not present

## 2019-09-30 DIAGNOSIS — G7080 Lambert-Eaton syndrome, unspecified: Secondary | ICD-10-CM | POA: Diagnosis not present

## 2019-09-30 DIAGNOSIS — J9621 Acute and chronic respiratory failure with hypoxia: Secondary | ICD-10-CM | POA: Diagnosis not present

## 2019-09-30 NOTE — Progress Notes (Addendum)
Pulmonary Critical Care Medicine [facility] [place]   PULMONARY CRITICAL CARE SERVICE  PROGRESS NOTE  Date of Service: 09/30/2019  Bernard Hudson  LKG:[id number]  DOB: Jul 18, 1975   DOA: 09/02/2019  Referring Physician: Carron Curie, MD  HPI: Bernard Hudson is a 44 y.o. male seen for follow up of Acute on Chronic Respiratory Failure.  Patient remains capped on room air satting well no fever distress.  Medications: Reviewed on Rounds  Physical Exam:  Vitals: Pulse 75 respirations 15 BP 102/63 O2 sat 100% temp 97.7  Ventilator Settings not currently on ventilator  . General: Comfortable at this time . Eyes: Grossly normal lids, irises & conjunctiva . ENT: grossly tongue is normal . Neck: no obvious mass . Cardiovascular: S1 S2 normal no gallop . Respiratory: No rales or rhonchi noted . Abdomen: soft . Skin: no rash seen on limited exam . Musculoskeletal: not rigid . Psychiatric:unable to assess . Neurologic: no seizure no involuntary movements         Lab Data:   Basic Metabolic Panel: Recent Labs  Lab 09/28/19 1115  NA 137  K 3.7  CL 99  CO2 29  GLUCOSE 97  BUN 16  CREATININE <0.30*  CALCIUM 9.4    ABG: No results for input(s): PHART, PCO2ART, PO2ART, HCO3, O2SAT in the last 168 hours.  Liver Function Tests: No results for input(s): AST, ALT, ALKPHOS, BILITOT, PROT, ALBUMIN in the last 168 hours. No results for input(s): LIPASE, AMYLASE in the last 168 hours. No results for input(s): AMMONIA in the last 168 hours.  CBC: Recent Labs  Lab 09/28/19 1115  WBC 12.1*  HGB 11.7*  HCT 38.6*  MCV 67.5*  PLT 310    Cardiac Enzymes: No results for input(s): CKTOTAL, CKMB, CKMBINDEX, TROPONINI in the last 168 hours.  BNP (last 3 results) No results for input(s): BNP in the last 8760 hours.  ProBNP (last 3 results) No results for input(s): PROBNP in the last 8760 hours.  Radiological Exams: No results found.  Assessment/Plan Principal  Problem:   Acute on chronic respiratory failure with hypoxia (HCC) Active Problems:   Eaton-Lambert myasthenic syndrome (HCC)   Acute arterial ischemic stroke, multifocal, anterior circulation (HCC)   Dysphagia as late effect of cerebrovascular accident (CVA)   1. Acute on chronic respiratory failure with hypoxia we will continue with capping trials awaiting discharge planning we will need to go to rehab With the capping still because with cough  2. Eaton-Lambert syndrome slow improvement we will continue to follow 3. Acute stroke supportive care therapy as tolerated 4. Dysphagia failed attempt at swallowing   I have personally seen and evaluated the patient, evaluated laboratory and imaging results, formulated the assessment and plan and placed orders. The Patient requires high complexity decision making with multiple systems involvement.  Rounds were done with the Respiratory Therapy Director and Staff therapists and discussed with nursing staff also.  Yevonne Pax, MD [facility] Pulmonary Critical Care Medicine Sleep Medicine

## 2019-10-01 DIAGNOSIS — G7080 Lambert-Eaton syndrome, unspecified: Secondary | ICD-10-CM | POA: Diagnosis not present

## 2019-10-01 DIAGNOSIS — J9621 Acute and chronic respiratory failure with hypoxia: Secondary | ICD-10-CM | POA: Diagnosis not present

## 2019-10-01 DIAGNOSIS — I63521 Cerebral infarction due to unspecified occlusion or stenosis of right anterior cerebral artery: Secondary | ICD-10-CM | POA: Diagnosis not present

## 2019-10-01 DIAGNOSIS — I69391 Dysphagia following cerebral infarction: Secondary | ICD-10-CM | POA: Diagnosis not present

## 2019-10-01 NOTE — Progress Notes (Addendum)
Pulmonary Critical Care Medicine [facility] [place]   PULMONARY CRITICAL CARE SERVICE  PROGRESS NOTE  Date of Service: 10/01/2019  [name]  [id number]:[id number]  DOB: 10/08/1975   DOA: 09/02/2019  Referring Physician: Carron Curie, MD  HPI: Bernard Hudson is a 44 y.o. male seen for follow up of Acute on Chronic Respiratory Failure.  Patient remains capped on room air at this time satting well no fever or distress.  Medications: Reviewed on Rounds  Physical Exam:  Vitals: Pulse 80 respirations 12 BP 126/72 O2 sat 99% temp 98.4  Ventilator Settings not currently on ventilator  . General: Comfortable at this time . Eyes: Grossly normal lids, irises & conjunctiva . ENT: grossly tongue is normal . Neck: no obvious mass . Cardiovascular: S1 S2 normal no gallop . Respiratory: No rales or rhonchi noted . Abdomen: soft . Skin: no rash seen on limited exam . Musculoskeletal: not rigid . Psychiatric:unable to assess . Neurologic: no seizure no involuntary movements         Lab Data:   Basic Metabolic Panel: Recent Labs  Lab 09/28/19 1115  NA 137  K 3.7  CL 99  CO2 29  GLUCOSE 97  BUN 16  CREATININE <0.30*  CALCIUM 9.4    ABG: No results for input(s): PHART, PCO2ART, PO2ART, HCO3, O2SAT in the last 168 hours.  Liver Function Tests: No results for input(s): AST, ALT, ALKPHOS, BILITOT, PROT, ALBUMIN in the last 168 hours. No results for input(s): LIPASE, AMYLASE in the last 168 hours. No results for input(s): AMMONIA in the last 168 hours.  CBC: Recent Labs  Lab 09/28/19 1115  WBC 12.1*  HGB 11.7*  HCT 38.6*  MCV 67.5*  PLT 310    Cardiac Enzymes: No results for input(s): CKTOTAL, CKMB, CKMBINDEX, TROPONINI in the last 168 hours.  BNP (last 3 results) No results for input(s): BNP in the last 8760 hours.  ProBNP (last 3 results) No results for input(s): PROBNP in the last 8760 hours.  Radiological Exams: No results  found.  Assessment/Plan Principal Problem:   Acute on chronic respiratory failure with hypoxia (HCC) Active Problems:   Eaton-Lambert myasthenic syndrome (HCC)   Acute arterial ischemic stroke, multifocal, anterior circulation (HCC)   Dysphagia as late effect of cerebrovascular accident (CVA)   1. Acute on chronic respiratory failure with hypoxia we will continue with capping trials awaiting discharge planning we will need to go to rehab With the capping still because with cough  2. Eaton-Lambert syndrome slow improvement we will continue to follow 3. Acute stroke supportive care therapy as tolerated 4. Dysphagia failed attempt at swallowing   I have personally seen and evaluated the patient, evaluated laboratory and imaging results, formulated the assessment and plan and placed orders. The Patient requires high complexity decision making with multiple systems involvement.  Rounds were done with the Respiratory Therapy Director and Staff therapists and discussed with nursing staff also.  Yevonne Pax, MD [facility] Pulmonary Critical Care Medicine Sleep Medicine

## 2019-10-02 DIAGNOSIS — J9621 Acute and chronic respiratory failure with hypoxia: Secondary | ICD-10-CM | POA: Diagnosis not present

## 2019-10-02 DIAGNOSIS — I69391 Dysphagia following cerebral infarction: Secondary | ICD-10-CM | POA: Diagnosis not present

## 2019-10-02 DIAGNOSIS — G7080 Lambert-Eaton syndrome, unspecified: Secondary | ICD-10-CM | POA: Diagnosis not present

## 2019-10-02 DIAGNOSIS — I63521 Cerebral infarction due to unspecified occlusion or stenosis of right anterior cerebral artery: Secondary | ICD-10-CM | POA: Diagnosis not present

## 2019-10-02 NOTE — Progress Notes (Signed)
Pulmonary Critical Care Medicine [facility] [place]   PULMONARY CRITICAL CARE SERVICE  PROGRESS NOTE  Date of Service: 10/02/2019  Bernard Hudson  RXV:[id number]  DOB: October 17, 1975   DOA: 09/02/2019  Referring Physician: Carron Curie, MD  HPI: Bernard Hudson is a 44 y.o. male seen for follow up of Acute on Chronic Respiratory Failure. Patient at this time is doing fine with capping he was comfortable without any acute distress.  Medications: Reviewed on Rounds  Physical Exam:  Vitals: Temperature is 97.5 pulse 77 respiratory rate 19 blood pressure is 127/76 saturations 100%  Ventilator Settings off the ventilator right now capping  . General: Comfortable at this time . Eyes: Grossly normal lids, irises & conjunctiva . ENT: grossly tongue is normal . Neck: no obvious mass . Cardiovascular: S1 S2 normal no gallop . Respiratory: No rhonchi very coarse breath sounds . Abdomen: soft . Skin: no rash seen on limited exam . Musculoskeletal: not rigid . Psychiatric:unable to assess . Neurologic: no seizure no involuntary movements         Lab Data:   Basic Metabolic Panel: Recent Labs  Lab 09/28/19 1115  NA 137  K 3.7  CL 99  CO2 29  GLUCOSE 97  BUN 16  CREATININE <0.30*  CALCIUM 9.4    ABG: No results for input(s): PHART, PCO2ART, PO2ART, HCO3, O2SAT in the last 168 hours.  Liver Function Tests: No results for input(s): AST, ALT, ALKPHOS, BILITOT, PROT, ALBUMIN in the last 168 hours. No results for input(s): LIPASE, AMYLASE in the last 168 hours. No results for input(s): AMMONIA in the last 168 hours.  CBC: Recent Labs  Lab 09/28/19 1115  WBC 12.1*  HGB 11.7*  HCT 38.6*  MCV 67.5*  PLT 310    Cardiac Enzymes: No results for input(s): CKTOTAL, CKMB, CKMBINDEX, TROPONINI in the last 168 hours.  BNP (last 3 results) No results for input(s): BNP in the last 8760 hours.  ProBNP (last 3 results) No results for input(s): PROBNP in the last 8760  hours.  Radiological Exams: No results found.  Assessment/Plan Principal Problem:   Acute on chronic respiratory failure with hypoxia (HCC) Active Problems:   Eaton-Lambert myasthenic syndrome (HCC)   Acute arterial ischemic stroke, multifocal, anterior circulation (HCC)   Dysphagia as late effect of cerebrovascular accident (CVA)   1. Acute on chronic respiratory failure with hypoxia we will continue with capping trials as ordered awaiting discharge to rehab with at that time planning for decannulation. 2. Eaton-Lambert myasthenia no change continue with supportive care overall though he has had a significant improvement 3. Acute stroke slow improvement will need ongoing therapy 4. Dysphagia failed swallowing test we will continue to monitor   I have personally seen and evaluated the patient, evaluated laboratory and imaging results, formulated the assessment and plan and placed orders. The Patient requires high complexity decision making with multiple systems involvement.  Rounds were done with the Respiratory Therapy Director and Staff therapists and discussed with nursing staff also.  Yevonne Pax, MD [facility] Pulmonary Critical Care Medicine Sleep Medicine

## 2019-10-03 DIAGNOSIS — I69391 Dysphagia following cerebral infarction: Secondary | ICD-10-CM | POA: Diagnosis not present

## 2019-10-03 DIAGNOSIS — G7080 Lambert-Eaton syndrome, unspecified: Secondary | ICD-10-CM | POA: Diagnosis not present

## 2019-10-03 DIAGNOSIS — J9621 Acute and chronic respiratory failure with hypoxia: Secondary | ICD-10-CM | POA: Diagnosis not present

## 2019-10-03 DIAGNOSIS — I63521 Cerebral infarction due to unspecified occlusion or stenosis of right anterior cerebral artery: Secondary | ICD-10-CM | POA: Diagnosis not present

## 2019-10-03 NOTE — Progress Notes (Signed)
Pulmonary Critical Care Medicine [facility] [place]   PULMONARY CRITICAL CARE SERVICE  PROGRESS NOTE  Date of Service: 10/03/2019  Bernard Hudson  [id number]  DOB: 1975/08/07   DOA: 09/02/2019  Referring Physician: Carron Curie, MD  HPI: Bernard Hudson is a 44 y.o. male seen for follow up of Acute on Chronic Respiratory Failure.  Patient is capping right now has been on room air has a very weak cough  Medications: Reviewed on Rounds  Physical Exam:  Vitals: Temperature is 97.7 pulse 78 respiratory 16 blood pressure is 109/69 saturations 100%  Ventilator Settings capping off the vent  . General: Comfortable at this time . Eyes: Grossly normal lids, irises & conjunctiva . ENT: grossly tongue is normal . Neck: no obvious mass . Cardiovascular: S1 S2 normal no gallop . Respiratory: No rhonchi no rales . Abdomen: soft . Skin: no rash seen on limited exam . Musculoskeletal: not rigid . Psychiatric:unable to assess . Neurologic: no seizure no involuntary movements         Lab Data:   Basic Metabolic Panel: Recent Labs  Lab 09/28/19 1115  NA 137  K 3.7  CL 99  CO2 29  GLUCOSE 97  BUN 16  CREATININE <0.30*  CALCIUM 9.4    ABG: No results for input(s): PHART, PCO2ART, PO2ART, HCO3, O2SAT in the last 168 hours.  Liver Function Tests: No results for input(s): AST, ALT, ALKPHOS, BILITOT, PROT, ALBUMIN in the last 168 hours. No results for input(s): LIPASE, AMYLASE in the last 168 hours. No results for input(s): AMMONIA in the last 168 hours.  CBC: Recent Labs  Lab 09/28/19 1115  WBC 12.1*  HGB 11.7*  HCT 38.6*  MCV 67.5*  PLT 310    Cardiac Enzymes: No results for input(s): CKTOTAL, CKMB, CKMBINDEX, TROPONINI in the last 168 hours.  BNP (last 3 results) No results for input(s): BNP in the last 8760 hours.  ProBNP (last 3 results) No results for input(s): PROBNP in the last 8760 hours.  Radiological Exams: No results  found.  Assessment/Plan Principal Problem:   Acute on chronic respiratory failure with hypoxia (HCC) Active Problems:   Eaton-Lambert myasthenic syndrome (HCC)   Acute arterial ischemic stroke, multifocal, anterior circulation (HCC)   Dysphagia as late effect of cerebrovascular accident (CVA)   1. Acute on chronic respiratory failure hypoxia we will continue with capping we will plan on decannulation at this time 2. Eaton-Lambert syndrome needs ongoing rehab will be transferred to acute rehab 3. Acute stroke no change continue with supportive care 4. Dysphagia failed swallowing assessment due to significant weakness   I have personally seen and evaluated the patient, evaluated laboratory and imaging results, formulated the assessment and plan and placed orders. The Patient requires high complexity decision making with multiple systems involvement.  Rounds were done with the Respiratory Therapy Director and Staff therapists and discussed with nursing staff also.  Yevonne Pax, MD [facility] Pulmonary Critical Care Medicine Sleep Medicine

## 2022-05-20 IMAGING — DX DG ABDOMEN 1V
1 series · 1 of 1 positions shown · non-contrast
Comparison: None.

CLINICAL DATA: Peg tube placement

EXAM:
ABDOMEN - 1 VIEW

[abdomen kub]
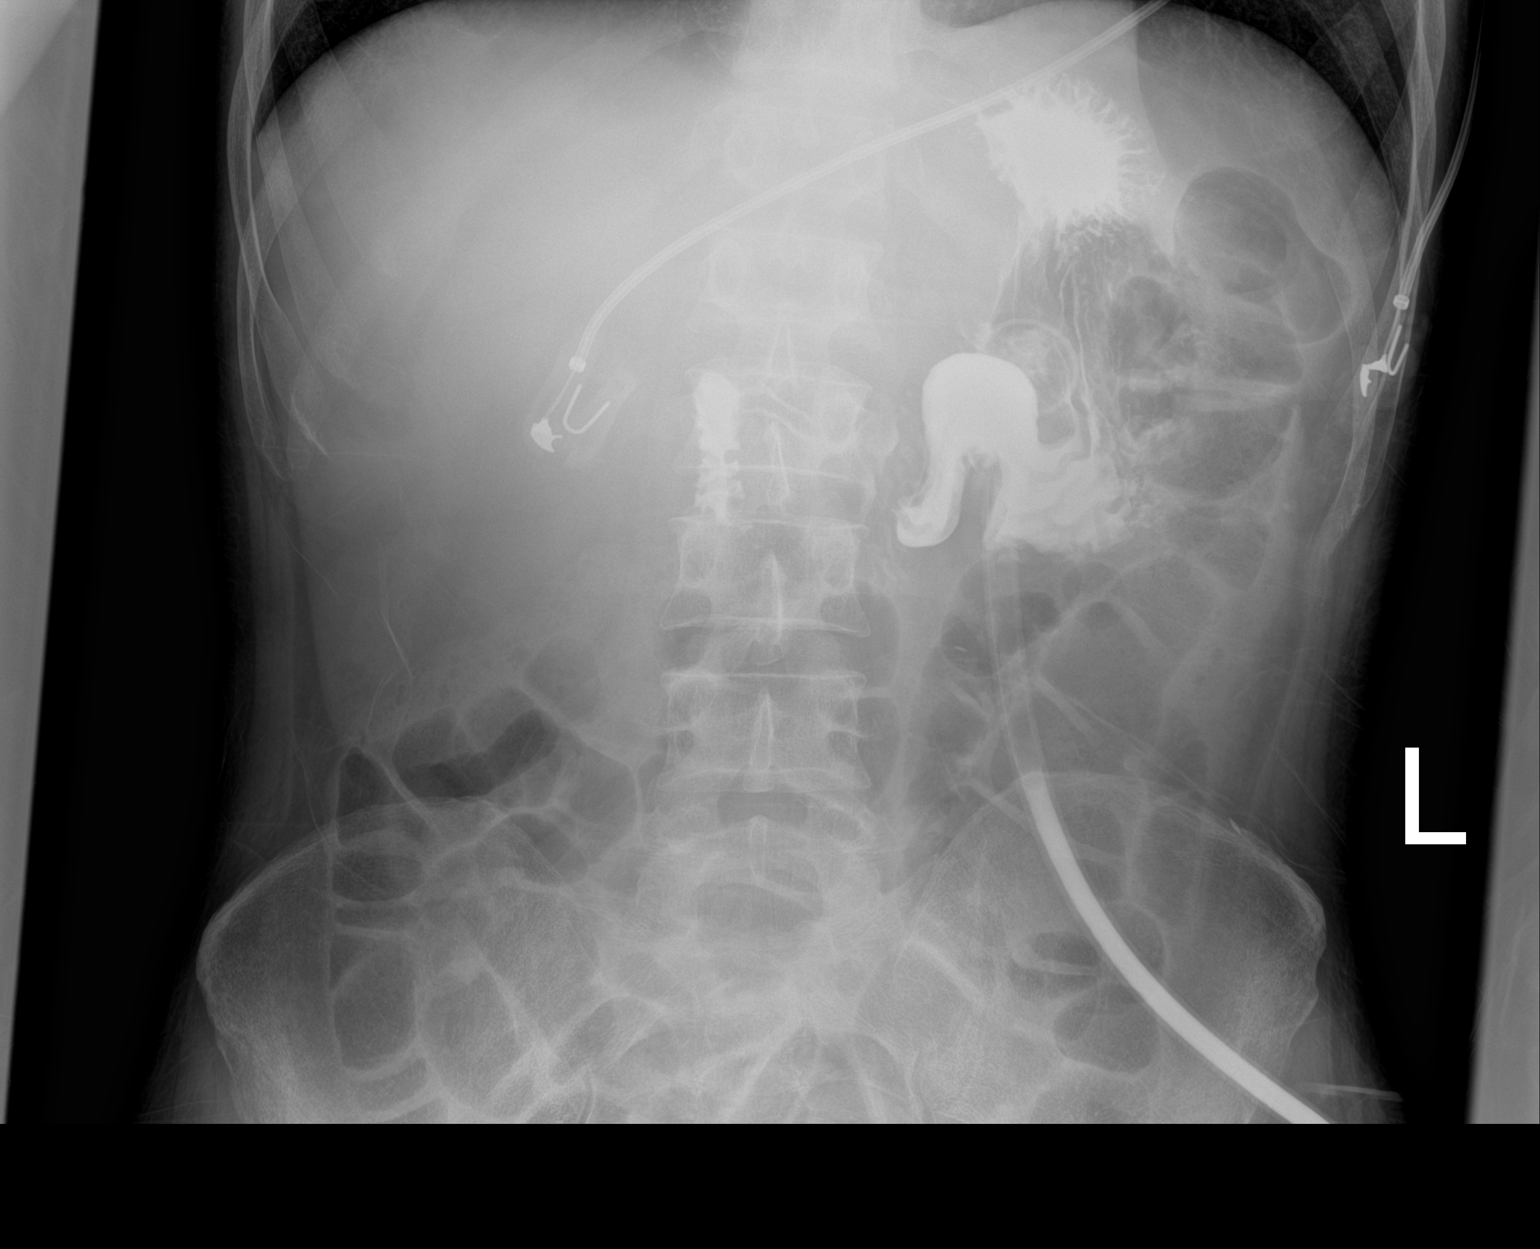

[1 of 1 positions shown; findings below may reference images not displayed]

FINDINGS: 30 cc Omnipaque 300 administered through patient's PEG tube. A
single overhead image of the abdomen obtained. Contrast opacifies
the stomach and proximal duodenum. There is no gross extravasation
of contrast.
IMPRESSION: Gastrostomy tube appears to be within the stomach. No gross
extravasation.

## 2022-05-20 IMAGING — DX DG CHEST 1V PORT
1 series · 1 of 1 positions shown · non-contrast
Comparison: None.

CLINICAL DATA: Respiratory failure

EXAM:
PORTABLE CHEST 1 VIEW

[chest ap]
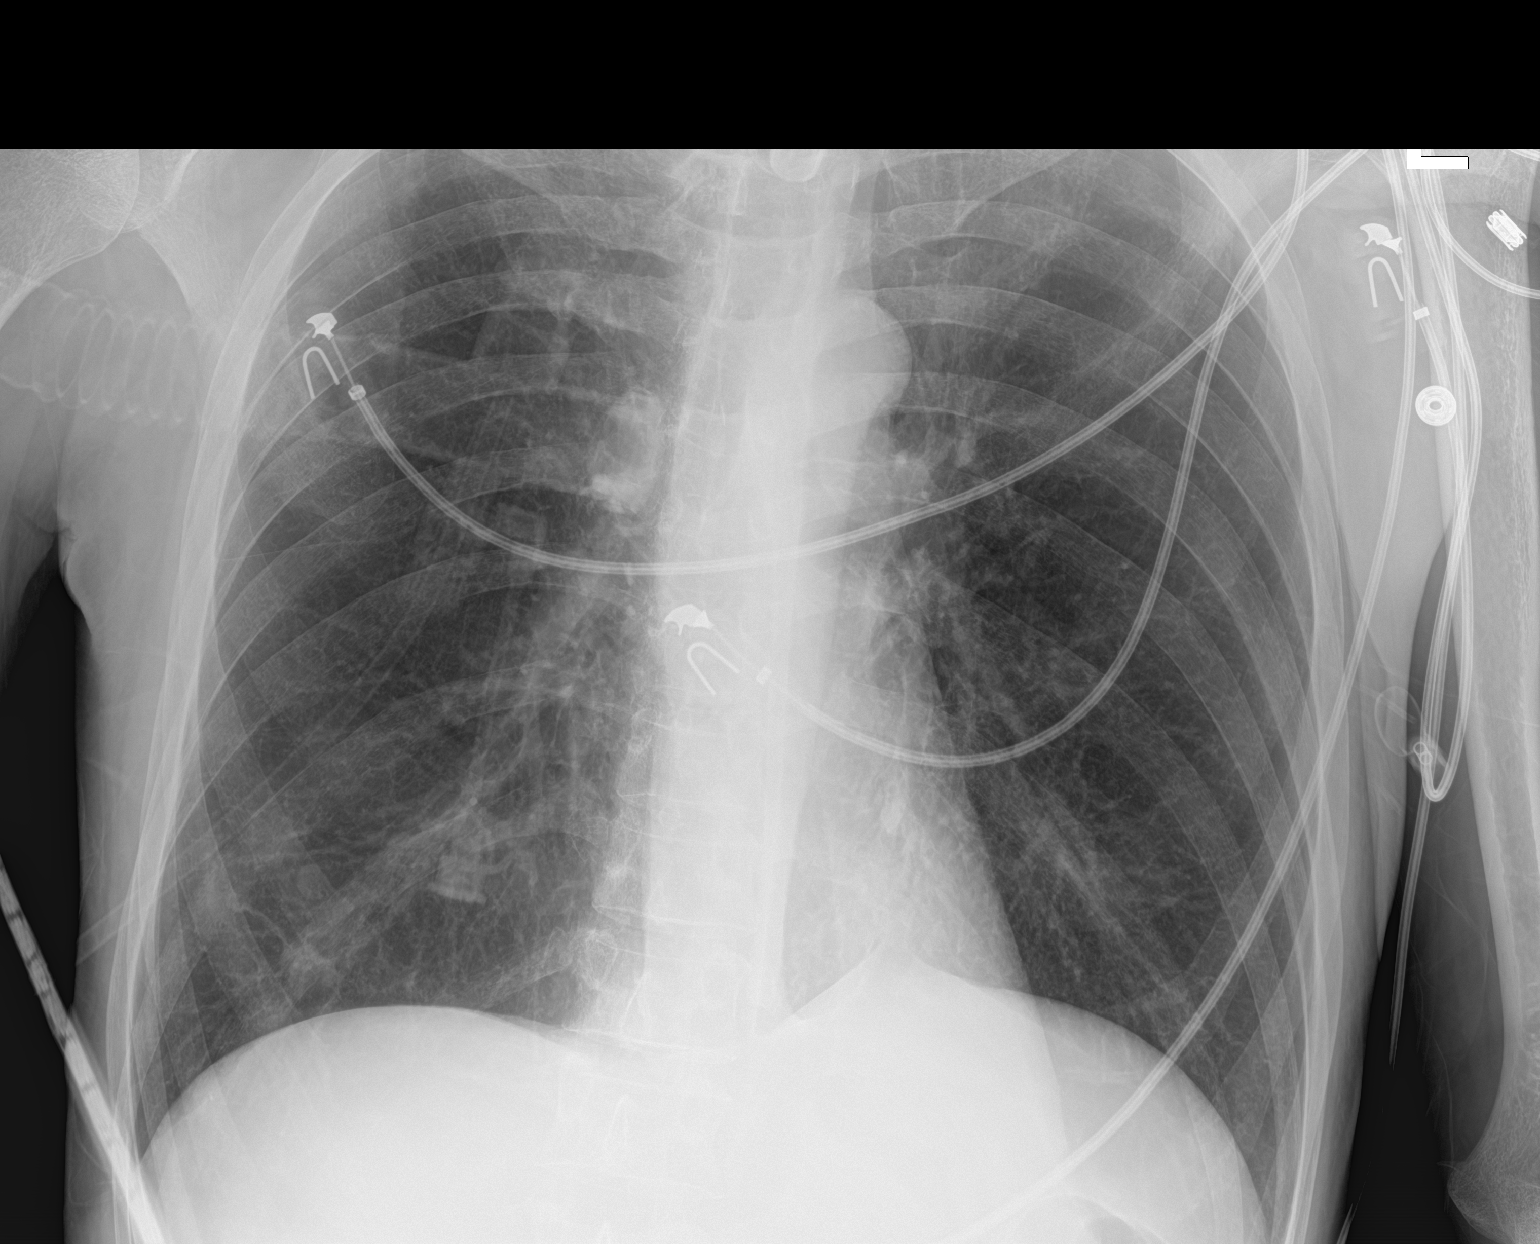

[1 of 1 positions shown; findings below may reference images not displayed]

FINDINGS: Tracheostomy tube in place with tip about 5 cm superior to the
carina. No focal opacity or pleural effusion. Normal
cardiomediastinal silhouette. No pneumothorax.
IMPRESSION: No active disease.
